# Patient Record
Sex: Female | Born: 1980 | Hispanic: No | Marital: Married | Smoking: Current every day smoker
Health system: Southern US, Community
[De-identification: ages and names within clinical notes are randomized; demographics above are authoritative.]

## PROBLEM LIST (undated history)

## (undated) DIAGNOSIS — F909 Attention-deficit hyperactivity disorder, unspecified type: Secondary | ICD-10-CM

## (undated) DIAGNOSIS — J45909 Unspecified asthma, uncomplicated: Secondary | ICD-10-CM

## (undated) HISTORY — PX: ABDOMINAL HYSTERECTOMY: SHX81

## (undated) HISTORY — PX: GASTRIC BYPASS: SHX52

---

## 1998-06-19 DIAGNOSIS — K5090 Crohn's disease, unspecified, without complications: Secondary | ICD-10-CM

## 1998-06-19 HISTORY — DX: Crohn's disease, unspecified, without complications: K50.90

## 1998-06-19 HISTORY — PX: CHOLECYSTECTOMY: SHX55

## 2003-06-20 HISTORY — PX: TUBAL LIGATION: SHX77

## 2007-06-20 HISTORY — PX: ABDOMINAL HYSTERECTOMY: SHX81

## 2010-06-19 DIAGNOSIS — I82409 Acute embolism and thrombosis of unspecified deep veins of unspecified lower extremity: Secondary | ICD-10-CM

## 2010-06-19 DIAGNOSIS — I2699 Other pulmonary embolism without acute cor pulmonale: Secondary | ICD-10-CM

## 2010-06-19 HISTORY — PX: KNEE SURGERY: SHX244

## 2010-06-19 HISTORY — DX: Acute embolism and thrombosis of unspecified deep veins of unspecified lower extremity: I82.409

## 2010-06-19 HISTORY — DX: Other pulmonary embolism without acute cor pulmonale: I26.99

## 2011-06-20 DIAGNOSIS — D333 Benign neoplasm of cranial nerves: Secondary | ICD-10-CM

## 2011-06-20 HISTORY — PX: KNEE SURGERY: SHX244

## 2011-06-20 HISTORY — DX: Benign neoplasm of cranial nerves: D33.3

## 2013-06-19 HISTORY — PX: HARDWARE REMOVAL: SHX979

## 2013-06-19 HISTORY — PX: TENDON RECONSTRUCTION: SHX2487

## 2015-10-26 DIAGNOSIS — Z9884 Bariatric surgery status: Secondary | ICD-10-CM | POA: Insufficient documentation

## 2015-10-26 DIAGNOSIS — Z980 Intestinal bypass and anastomosis status: Secondary | ICD-10-CM | POA: Insufficient documentation

## 2016-06-19 DIAGNOSIS — Z8719 Personal history of other diseases of the digestive system: Secondary | ICD-10-CM

## 2016-06-19 HISTORY — PX: LAPAROSCOPIC GASTRIC SLEEVE RESECTION: SHX5895

## 2016-06-19 HISTORY — DX: Personal history of other diseases of the digestive system: Z87.19

## 2017-06-19 HISTORY — PX: GASTRIC BYPASS: SHX52

## 2021-07-20 ENCOUNTER — Ambulatory Visit
Admission: EM | Admit: 2021-07-20 | Discharge: 2021-07-20 | Disposition: A | Discharge status: Home / Self Care | Attending: Internal Medicine | Admitting: Internal Medicine

## 2021-07-20 ENCOUNTER — Other Ambulatory Visit: Payer: Self-pay

## 2021-07-20 DIAGNOSIS — J069 Acute upper respiratory infection, unspecified: Secondary | ICD-10-CM | POA: Diagnosis present

## 2021-07-20 DIAGNOSIS — J029 Acute pharyngitis, unspecified: Secondary | ICD-10-CM | POA: Diagnosis present

## 2021-07-20 LAB — POCT INFLUENZA A/B
Influenza A, POC: NEGATIVE
Influenza B, POC: NEGATIVE

## 2021-07-20 LAB — POCT RAPID STREP A (OFFICE): Rapid Strep A Screen: NEGATIVE

## 2021-07-20 MED ORDER — ACETAMINOPHEN 325 MG PO TABS
650.0000 mg | ORAL_TABLET | Freq: Once | ORAL | Status: AC
  Administered 2021-07-20: 650 mg via ORAL

## 2021-07-20 MED ORDER — BENZONATATE 100 MG PO CAPS: 100.0000 mg | ORAL_CAPSULE | Freq: Three times a day (TID) | ORAL | 0 refills | Status: DC | PRN

## 2021-07-20 NOTE — ED Triage Notes (Signed)
2 day h/o runny nose, HA, body aches, cough, fever and sore throat with dysphagia. Has been taking theraflu without relief. Tmax 100.6. No v/d.

## 2021-07-20 NOTE — ED Provider Notes (Signed)
[facility]    CSN: [id number] Arrival date & time: 07/20/21  1324      History   Chief Complaint Chief Complaint  Patient presents with   Generalized Body Aches   Cough    HPI Jaydalee Bardwell is a 41 y.o. female.   Patient presents with 2-day history of runny nose, headache, body aches, nonproductive cough, fever, sore throat.  T-max at home was 100.6.  Denies any known sick contacts.  Patient has taken TheraFlu with minimal improvement in symptoms.  Denies chest pain, shortness of breath, nausea, vomiting, diarrhea, abdominal pain.   Cough  History reviewed. No pertinent past medical history.  There are no problems to display for this patient.   Past Surgical History:  Procedure Laterality Date   ABDOMINAL HYSTERECTOMY     partial   GASTRIC BYPASS      OB History   No obstetric history on file.      Home Medications    Prior to Admission medications   Medication Sig Start Date End Date Taking? Authorizing Provider  benzonatate (TESSALON) 100 MG capsule Take 1 capsule (100 mg total) by mouth every 8 (eight) hours as needed for cough. 07/20/21  Yes , Michele Rockers, FNP  omeprazole (PRILOSEC) 20 MG capsule omeprazole 20 mg oral delayed release capsule Start Date: 01/31/19 Status: Ordered 01/31/19  Yes [provider]    Family History Family History  Problem Relation Age of Onset   Hypertension Mother    Diabetes Father     Social History Social History   Tobacco Use   Smoking status: Every Day    Types: Cigarettes   Smokeless tobacco: Never     Allergies   Azithromycin, Cephalexin, Ciprofloxacin, Erythromycin, Ketorolac, Metronidazole, Morphine, Nitrofurantoin, Ondansetron, and Penicillins   Review of Systems Review of Systems Per HPI  Physical Exam Triage Vital Signs ED Triage Vitals  Enc Vitals Group     BP 07/20/21 1030 120/80     Pulse Rate 07/20/21 1030 88     Resp 07/20/21 1030 18     Temp 07/20/21 1030 (!) 100.4  F (38 C)     Temp Source 07/20/21 1030 Oral     SpO2 07/20/21 1030 97 %     Weight --      Height --      Head Circumference --      Peak Flow --      Pain Score 07/20/21 1032 8     Pain Loc --      Pain Edu? --      Excl. in [place]? --    No data found.  Updated Vital Signs BP 120/80 (BP Location: Left Arm)    Pulse 88    Temp (!) 100.4 F (38 C) (Oral)    Resp 18    SpO2 97%   Visual Acuity Right Eye Distance:   Left Eye Distance:   Bilateral Distance:    Right Eye Near:   Left Eye Near:    Bilateral Near:     Physical Exam Constitutional:      General: She is not in acute distress.    Appearance: Normal appearance. She is not toxic-appearing or diaphoretic.  HENT:     Head: Normocephalic and atraumatic.     Right Ear: Tympanic membrane and ear canal normal.     Left Ear: Tympanic membrane and ear canal normal.     Nose: Congestion present.     Mouth/Throat:  Mouth: Mucous membranes are moist.     Pharynx: Posterior oropharyngeal erythema present.  Eyes:     Extraocular Movements: Extraocular movements intact.     Conjunctiva/sclera: Conjunctivae normal.     Pupils: Pupils are equal, round, and reactive to light.  Cardiovascular:     Rate and Rhythm: Normal rate and regular rhythm.     Pulses: Normal pulses.     Heart sounds: Normal heart sounds.  Pulmonary:     Effort: Pulmonary effort is normal. No respiratory distress.     Breath sounds: Normal breath sounds. No stridor. No wheezing, rhonchi or rales.  Abdominal:     General: Abdomen is flat. Bowel sounds are normal.     Palpations: Abdomen is soft.  Musculoskeletal:        General: Normal range of motion.     Cervical back: Normal range of motion.  Skin:    General: Skin is warm and dry.  Neurological:     General: No focal deficit present.     Mental Status: She is alert and oriented to person, place, and time. Mental status is at baseline.  Psychiatric:        Mood and Affect: Mood normal.         Behavior: Behavior normal.     UC Treatments / Results  Labs (all labs ordered are listed, but only abnormal results are displayed) Labs Reviewed  NOVEL CORONAVIRUS, NAA  CULTURE, GROUP A STREP [facility])  POCT INFLUENZA A/B  POCT RAPID STREP A (OFFICE)    EKG   Radiology No results found.  Procedures Procedures (including critical care time)  Medications Ordered in UC Medications  acetaminophen (TYLENOL) tablet 650 mg (650 mg Oral Given 07/20/21 1039)    Initial Impression / Assessment and Plan / UC Course  I have reviewed the triage vital signs and the nursing notes.  Pertinent labs & imaging results that were available during my care of the patient were reviewed by me and considered in my medical decision making (see chart for details).     Patient presents with symptoms likely from a viral upper respiratory infection. Differential includes bacterial pneumonia, sinusitis, allergic rhinitis, COVID-19, flu. Do not suspect underlying cardiopulmonary process. Symptoms seem unlikely related to ACS, CHF or COPD exacerbations, pneumonia, pneumothorax. Patient is nontoxic appearing and not in need of emergent medical intervention.  Rapid flu and rapid strep are negative.  Throat culture and COVID-19 viral swab are pending.  Recommended symptom control with over the counter medications.  Patient sent prescription.  Return if symptoms fail to improve in 1-2 weeks or you develop shortness of breath, chest pain, severe headache. Patient states understanding and is agreeable.  Discharged with PCP followup.  Final Clinical Impressions(s) / UC Diagnoses   Final diagnoses:  Viral upper respiratory tract infection with cough  Sore throat     Discharge Instructions      It appears that you have a viral upper respiratory infection that should resolve in the next few days on its own.  You have been prescribed a cough medication.  Rapid strep and rapid flu are negative.  COVID-19 PCR is  pending.     ED Prescriptions     Medication Sig Dispense Auth. Provider   benzonatate (TESSALON) 100 MG capsule Take 1 capsule (100 mg total) by mouth every 8 (eight) hours as needed for cough. 21 capsule [place], Michele Rockers, [place]      PDMP not reviewed this encounter.   Arcata, Mena  E, FNP 07/20/21 1143

## 2021-07-20 NOTE — Discharge Instructions (Signed)
It appears that you have a viral upper respiratory infection that should resolve in the next few days on its own.  You have been prescribed a cough medication.  Rapid strep and rapid flu are negative.  COVID-19 PCR is pending.

## 2021-07-21 LAB — SARS-COV-2, NAA 2 DAY TAT

## 2021-07-21 LAB — NOVEL CORONAVIRUS, NAA: SARS-CoV-2, NAA: DETECTED — AB

## 2021-07-24 LAB — CULTURE, GROUP A STREP (THRC)

## 2021-08-15 ENCOUNTER — Ambulatory Visit (INDEPENDENT_AMBULATORY_CARE_PROVIDER_SITE_OTHER): Admitting: Nurse Practitioner

## 2021-08-15 ENCOUNTER — Encounter: Payer: Self-pay | Admitting: Nurse Practitioner

## 2021-08-15 ENCOUNTER — Other Ambulatory Visit: Payer: Self-pay

## 2021-08-15 VITALS — BP 112/79 | HR 79 | Resp 16 | Ht 63.0 in | Wt 206.0 lb

## 2021-08-15 DIAGNOSIS — R051 Acute cough: Secondary | ICD-10-CM | POA: Insufficient documentation

## 2021-08-15 DIAGNOSIS — J45909 Unspecified asthma, uncomplicated: Secondary | ICD-10-CM | POA: Insufficient documentation

## 2021-08-15 DIAGNOSIS — Z7689 Persons encountering health services in other specified circumstances: Secondary | ICD-10-CM | POA: Diagnosis not present

## 2021-08-15 DIAGNOSIS — K5010 Crohn's disease of large intestine without complications: Secondary | ICD-10-CM | POA: Insufficient documentation

## 2021-08-15 DIAGNOSIS — Z8616 Personal history of COVID-19: Secondary | ICD-10-CM

## 2021-08-15 DIAGNOSIS — H9192 Unspecified hearing loss, left ear: Secondary | ICD-10-CM

## 2021-08-15 DIAGNOSIS — F6389 Other impulse disorders: Secondary | ICD-10-CM | POA: Insufficient documentation

## 2021-08-15 MED ORDER — DOXYCYCLINE HYCLATE 100 MG PO TABS: 100.0000 mg | ORAL_TABLET | Freq: Two times a day (BID) | ORAL | 0 refills | Status: DC

## 2021-08-15 MED ORDER — PREDNISONE 10 MG PO TABS: 10.0000 mg | ORAL_TABLET | Freq: Every day | ORAL | 0 refills | Status: AC

## 2021-08-15 NOTE — Patient Instructions (Addendum)
Encounter to establish Left side hearing loss Chest congestion Cough History of COVID:   Stay well hydrated  Stay active  Deep breathing exercises  May take tylenol or fever or pain  May take mucinex twice daily  Will place referral to ENT  Will order:  Meds ordered this encounter  Medications   doxycycline (VIBRA-TABS) 100 MG tablet    Sig: Take 1 tablet (100 mg total) by mouth 2 (two) times daily for 10 days.    Dispense:  20 tablet    Refill:  0   predniSONE (DELTASONE) 10 MG tablet    Sig: Take 1 tablet (10 mg total) by mouth daily with breakfast for 5 days.    Dispense:  5 tablet    Refill:  0   Orders Placed This Encounter  Procedures   Ambulatory referral to ENT    Referral Priority:   Routine    Referral Type:   Consultation    Referral Reason:   Specialty Services Required    Requested Specialty:   Otolaryngology    Number of Visits Requested:   1      Follow up:  Follow up in 2 weeks for ADHD

## 2021-08-15 NOTE — Assessment & Plan Note (Signed)
Orders Placed This Encounter  Procedures   Ambulatory referral to ENT    Referral Priority:   Routine    Referral Type:   Consultation    Referral Reason:   Specialty Services Required    Requested Specialty:   Otolaryngology    Number of Visits Requested:   1    

## 2021-08-15 NOTE — Progress Notes (Signed)
@Patient  ID: Sophia Long, female    DOB: 01/11/1981, 41 y.o.   MRN: [id number]  Chief Complaint  Patient presents with   New Patient (Initial Visit)   Hospitalization Follow-up    Urgent care follow up     Referring provider: No ref. provider found  HPI  Patient presents today to establish care.  Patient states that she had COVID at the beginning of February and states that she still has chest congestion and cough.  She has not taken any antibiotics or any oral antiviral therapy for COVID.  We discussed that we can start her on a round of antibiotics and prednisone to help her get over the chest congestion and cough.  Patient does have a remote history of asthma.  Patient does have a history of ADHD, left acoustic neuroma (which she needs a referral to ENT), history of insomnia, and history of bilateral patellar realignment. Patient has been out of her ADHD medication and ambien for about 6 months now. Denies f/c/s, n/v/d, hemoptysis, PND, leg swelling Denies chest pain or edema.  Note: discussed with patient that Adderall and Ambien are controlled substances.  I will not be her PCP but will get an appointment set up for her with her new PCP to discuss refilling Ambien and Adderall going forward.  I would not be able to refill these today due to the medications being controlled.  Patient is fine with this.     Allergies  Allergen Reactions   Azithromycin     Per [facility] [id number]    Cephalexin    Ciprofloxacin    Erythromycin    Ketorolac    Metronidazole    Morphine    Nitrofurantoin     DIFFICULTY BREATHING,HIVES    Ondansetron     Per [facility] [id number]    Penicillins     TROUBLE BREATHING      There is no immunization history on file for this patient.  History reviewed. No pertinent past medical history.  Tobacco History: Social History   Tobacco Use  Smoking Status Every Day   Types: Cigarettes  Smokeless Tobacco Never   Ready to quit: Not  Answered Counseling given: Not Answered   Outpatient Encounter Medications as of 08/15/2021  Medication Sig   amphetamine-dextroamphetamine (ADDERALL) 30 MG tablet    doxycycline (VIBRA-TABS) 100 MG tablet Take 1 tablet (100 mg total) by mouth 2 (two) times daily for 10 days.   omeprazole (PRILOSEC) 20 MG capsule omeprazole 20 mg oral delayed release capsule Start Date: 01/31/19 Status: Ordered   predniSONE (DELTASONE) 10 MG tablet Take 1 tablet (10 mg total) by mouth daily with breakfast for 5 days.   zolpidem (AMBIEN) 10 MG tablet zolpidem 10 mg oral tablet Start Date: 02/21/19 Status: Ordered   benzonatate (TESSALON) 100 MG capsule Take 1 capsule (100 mg total) by mouth every 8 (eight) hours as needed for cough.   No facility-administered encounter medications on file as of 08/15/2021.     Review of Systems  Review of Systems  Constitutional: Negative.   HENT: Negative.    Respiratory:  Positive for cough.        Chest congestion  Cardiovascular: Negative.   Gastrointestinal: Negative.   Allergic/Immunologic: Negative.   Neurological: Negative.   Psychiatric/Behavioral: Negative.        Physical Exam  BP 112/79    Pulse 79    Resp 16    Ht 5\' 3"  (1.6 m)    Wt 206 lb (93.4 kg)  SpO2 98%    BMI 36.49 kg/m   Wt Readings from Last 5 Encounters:  08/15/21 206 lb (93.4 kg)     Physical Exam Vitals and nursing note reviewed.  Constitutional:      General: She is not in acute distress.    Appearance: She is well-developed.  Cardiovascular:     Rate and Rhythm: Normal rate and regular rhythm.  Pulmonary:     Effort: Pulmonary effort is normal.     Breath sounds: Normal breath sounds.  Neurological:     Mental Status: She is alert and oriented to person, place, and time.     Lab Results:  CBC No results found for: WBC, RBC, HGB, HCT, PLT, MCV, MCH, MCHC, RDW, LYMPHSABS, MONOABS, EOSABS, BASOSABS  BMET No results found for: NA, K, CL, CO2, GLUCOSE, BUN,  CREATININE, CALCIUM, GFRNONAA, GFRAA  BNP No results found for: BNP  ProBNP No results found for: PROBNP  Imaging: No results found.   Assessment & Plan:   History of COVID-19 Orders Placed This Encounter  Procedures   Ambulatory referral to ENT    Referral Priority:   Routine    Referral Type:   Consultation    Referral Reason:   Specialty Services Required    Requested Specialty:   Otolaryngology    Number of Visits Requested:   1   Patient Instructions  Encounter to establish Left side hearing loss Chest congestion Cough History of COVID:   Stay well hydrated  Stay active  Deep breathing exercises  May take tylenol or fever or pain  May take mucinex twice daily  Will place referral to ENT  Will order:  Meds ordered this encounter  Medications   doxycycline (VIBRA-TABS) 100 MG tablet    Sig: Take 1 tablet (100 mg total) by mouth 2 (two) times daily for 10 days.    Dispense:  20 tablet    Refill:  0   predniSONE (DELTASONE) 10 MG tablet    Sig: Take 1 tablet (10 mg total) by mouth daily with breakfast for 5 days.    Dispense:  5 tablet    Refill:  0   Orders Placed This Encounter  Procedures   Ambulatory referral to ENT    Referral Priority:   Routine    Referral Type:   Consultation    Referral Reason:   Specialty Services Required    Requested Specialty:   Otolaryngology    Number of Visits Requested:   1      Follow up:  Follow up in 2 weeks for ADHD     Fenton Foy, NP 08/15/2021

## 2021-08-22 ENCOUNTER — Ambulatory Visit (INDEPENDENT_AMBULATORY_CARE_PROVIDER_SITE_OTHER): Admitting: Family Medicine

## 2021-08-22 ENCOUNTER — Other Ambulatory Visit: Payer: Self-pay

## 2021-08-22 ENCOUNTER — Encounter: Payer: Self-pay | Admitting: Family Medicine

## 2021-08-22 VITALS — BP 124/84 | HR 60 | Temp 97.9°F | Resp 16 | Wt 205.2 lb

## 2021-08-22 DIAGNOSIS — M25561 Pain in right knee: Secondary | ICD-10-CM | POA: Diagnosis not present

## 2021-08-22 DIAGNOSIS — G4700 Insomnia, unspecified: Secondary | ICD-10-CM | POA: Diagnosis not present

## 2021-08-22 DIAGNOSIS — K219 Gastro-esophageal reflux disease without esophagitis: Secondary | ICD-10-CM | POA: Diagnosis not present

## 2021-08-22 DIAGNOSIS — G8929 Other chronic pain: Secondary | ICD-10-CM

## 2021-08-22 DIAGNOSIS — F900 Attention-deficit hyperactivity disorder, predominantly inattentive type: Secondary | ICD-10-CM

## 2021-08-22 DIAGNOSIS — Z7689 Persons encountering health services in other specified circumstances: Secondary | ICD-10-CM

## 2021-08-22 DIAGNOSIS — M25562 Pain in left knee: Secondary | ICD-10-CM

## 2021-08-22 MED ORDER — TRAZODONE HCL 100 MG PO TABS: 100.0000 mg | ORAL_TABLET | Freq: Every day | ORAL | 1 refills | Status: AC

## 2021-08-22 MED ORDER — OMEPRAZOLE 20 MG PO CPDR: 20.0000 mg | DELAYED_RELEASE_CAPSULE | Freq: Every day | ORAL | 1 refills | Status: DC

## 2021-08-22 MED ORDER — AMPHETAMINE-DEXTROAMPHETAMINE 30 MG PO TABS: 30.0000 mg | ORAL_TABLET | Freq: Every day | ORAL | 0 refills | Status: DC

## 2021-08-22 NOTE — Progress Notes (Signed)
Patient is her to establish care with PCP ? ?Patient would like a medication refill ?Patient would like referral; for otho for knee and GI for stomach ulcer ?

## 2021-08-22 NOTE — Progress Notes (Signed)
? ?Established Patient Office Visit ? ?Subjective:  ?Patient ID: Sophia Long, female    DOB: 10-10-1980  Age: 41 y.o. MRN: [id number] ? ?CC:  ?Chief Complaint  ?Patient presents with  ? Follow-up  ? Depression  ? Medication Refill  ? ? ?HPI ?Sophia Long presents for follow up of chronic med issues including chronic bilateral knee pain and GERD and ADHD.Patient reports that she has had previous surgical intervention on her needs and requests a referral. ? ?No past medical history on file. ? ?Past Surgical History:  ?Procedure Laterality Date  ? ABDOMINAL HYSTERECTOMY    ? partial  ? GASTRIC BYPASS    ? ? ?Family History  ?Problem Relation Age of Onset  ? Hypertension Mother   ? Diabetes Father   ? ? ?Social History  ? ?Socioeconomic History  ? Marital status: Married  ?  Spouse name: Not on file  ? Number of children: Not on file  ? Years of education: Not on file  ? Highest education level: Not on file  ?Occupational History  ? Not on file  ?Tobacco Use  ? Smoking status: Every Day  ?  Types: Cigarettes  ? Smokeless tobacco: Never  ?Substance and Sexual Activity  ? Alcohol use: Not on file  ? Drug use: Not on file  ? Sexual activity: Yes  ?Other Topics Concern  ? Not on file  ?Social History Narrative  ? Not on file  ? ?Social Determinants of Health  ? ?Financial Resource Strain: Not on file  ?Food Insecurity: Not on file  ?Transportation Needs: Not on file  ?Physical Activity: Not on file  ?Stress: Not on file  ?Social Connections: Not on file  ?Intimate Partner Violence: Not on file  ? ? ?ROS ?Review of Systems  ?Psychiatric/Behavioral:  Positive for agitation, decreased concentration and sleep disturbance. Negative for self-injury and suicidal ideas. The patient is nervous/anxious and is hyperactive.   ?All other systems reviewed and are negative. ? ?Objective:  ? ?Today's Vitals: BP 124/84   Pulse 60   Temp 97.9 ?F (36.6 ?C) (Oral)   Resp 16   Wt 205 lb 3.2 oz (93.1 kg)   SpO2 98%   BMI 36.35 kg/m?   ? ?Physical Exam ?Vitals and nursing note reviewed.  ?Constitutional:   ?   General: She is not in acute distress. ?   Appearance: She is obese.  ?Cardiovascular:  ?   Rate and Rhythm: Normal rate and regular rhythm.  ?Pulmonary:  ?   Effort: Pulmonary effort is normal.  ?   Breath sounds: Normal breath sounds.  ?Abdominal:  ?   Palpations: Abdomen is soft.  ?   Tenderness: There is no abdominal tenderness.  ?Neurological:  ?   General: No focal deficit present.  ?   Mental Status: She is alert and oriented to person, place, and time.  ?Psychiatric:     ?   Mood and Affect: Mood is anxious.     ?   Speech: Speech is rapid and pressured.     ?   Behavior: Behavior is hyperactive. Behavior is cooperative.  ? ? ?Assessment & Plan:  ? ?1. Gastroesophageal reflux disease without esophagitis ?Omeprazole prescribed and patient referred to GI For further eval/mgt ? ?- omeprazole (PRILOSEC) 20 MG capsule; Take 1 capsule (20 mg total) by mouth daily.  Dispense: 90 capsule; Refill: 1 ?- Ambulatory referral to Gastroenterology ? ?2. Attention deficit hyperactivity disorder (ADHD), predominantly inattentive type ?Adderall refilled. Will attempt to get  records from previous provider. Monitor. ? ?- amphetamine-dextroamphetamine (ADDERALL) 30 MG tablet; Take 1 tablet by mouth daily.  Dispense: 30 tablet; Refill: 0 ? ?3. Insomnia, unspecified type ?Trazodone prescribed.  ? ?4. Chronic pain of both knees ?Referral to ortho for further eval/mgt. ? ?- Ambulatory referral to Orthopedic Surgery ? ? ?Outpatient Encounter Medications as of 08/22/2021  ?Medication Sig  ? amphetamine-dextroamphetamine (ADDERALL) 30 MG tablet   ? benzonatate (TESSALON) 100 MG capsule Take 1 capsule (100 mg total) by mouth every 8 (eight) hours as needed for cough. (Patient not taking: Reported on 08/22/2021)  ? doxycycline (VIBRA-TABS) 100 MG tablet Take 1 tablet (100 mg total) by mouth 2 (two) times daily for 10 days. (Patient not taking: Reported on 08/22/2021)   ? omeprazole (PRILOSEC) 20 MG capsule omeprazole 20 mg oral delayed release capsule ?Start Date: 01/31/19 ?Status: Ordered  ? zolpidem (AMBIEN) 10 MG tablet zolpidem 10 mg oral tablet ?Start Date: 02/21/19 ?Status: Ordered  ? ?No facility-administered encounter medications on file as of 08/22/2021.  ? ? ?Follow-up: No follow-ups on file.  ? ?Becky Sax, MD ? ?

## 2021-08-23 ENCOUNTER — Encounter: Payer: Self-pay | Admitting: Family Medicine

## 2021-08-25 ENCOUNTER — Ambulatory Visit (INDEPENDENT_AMBULATORY_CARE_PROVIDER_SITE_OTHER): Admitting: Orthopaedic Surgery

## 2021-08-25 ENCOUNTER — Other Ambulatory Visit: Payer: Self-pay

## 2021-08-25 ENCOUNTER — Ambulatory Visit (INDEPENDENT_AMBULATORY_CARE_PROVIDER_SITE_OTHER)

## 2021-08-25 ENCOUNTER — Ambulatory Visit: Payer: Self-pay

## 2021-08-25 ENCOUNTER — Encounter: Payer: Self-pay | Admitting: Orthopaedic Surgery

## 2021-08-25 DIAGNOSIS — M1711 Unilateral primary osteoarthritis, right knee: Secondary | ICD-10-CM

## 2021-08-25 DIAGNOSIS — M25562 Pain in left knee: Secondary | ICD-10-CM

## 2021-08-25 DIAGNOSIS — M25561 Pain in right knee: Secondary | ICD-10-CM

## 2021-08-25 DIAGNOSIS — M1712 Unilateral primary osteoarthritis, left knee: Secondary | ICD-10-CM | POA: Diagnosis not present

## 2021-08-25 DIAGNOSIS — G8929 Other chronic pain: Secondary | ICD-10-CM | POA: Diagnosis not present

## 2021-08-25 NOTE — Progress Notes (Signed)
? ?Office Visit Note ?  ?Patient: Sophia Long           ?Date of Birth: 1980/11/30           ?MRN: [id number] ?Visit Date: 08/25/2021 ?             ?Requested by: Dorna Mai, MD ?[phone] ?[place],  [place] [address] ?PCP: Dorna Mai, MD ? ? ?Assessment & Plan: ?Visit Diagnoses:  ?1. Primary osteoarthritis of right knee   ?2. Primary osteoarthritis of left knee   ? ? ?Plan: Impression is advanced patellofemoral arthritis of the right knee and mild osteoarthritis of the left knee.  Again treatment options were discussed to include nonoperative and operative treatments and their associated pros and cons.  For the left knee she is not ready for any operative treatment so we will continue conservative management.  For the right knee she is ready for arthroplasty and surgical treatment.  I do need to get MRI for preoperative planning to see if she is a candidate for patellofemoral arthroplasty versus a total knee replacement.  Patient will follow-up after the MRI. ? ?Follow-Up Instructions: No follow-ups on file.  ? ?Orders:  ?Orders Placed This Encounter  ?Procedures  ? XR KNEE 3 VIEW RIGHT  ? XR KNEE 3 VIEW LEFT  ? MR Knee Right w/o contrast  ? ?No orders of the defined types were placed in this encounter. ? ? ? ? Procedures: ?No procedures performed ? ? ?Clinical Data: ?No additional findings. ? ? ?Subjective: ?Chief Complaint  ?Patient presents with  ? Left Knee - Pain  ? Right Knee - Pain  ? ? ?HPI ? ?Sophia Long is a very pleasant 41 year old female here for evaluation of bilateral knee pain greater on the right.  She has diffuse pain throughout the right knee that is severe.  She has had 3 surgeries on the right knee that included tibial tubercle realignment and MPFL reconstruction.  She is also had several cortisone and Visco injections after the surgeries which were ineffective.  She also had a tibial tubercle realignment surgery on the left knee.  This was all done in [place].  She recently  relocated to [facility].  She feels constant pain and grinding behind her kneecaps worse on the right knee.  She has also undergone extensive physical therapy over several years.  She reports pain and swelling with increased activity and towards the end of the day. ? ?Review of Systems  ?Constitutional: Negative.   ?HENT: Negative.    ?Eyes: Negative.   ?Respiratory: Negative.    ?Cardiovascular: Negative.   ?Endocrine: Negative.   ?Musculoskeletal: Negative.   ?Neurological: Negative.   ?Hematological: Negative.   ?Psychiatric/Behavioral: Negative.    ?All other systems reviewed and are negative. ? ? ?Objective: ?Vital Signs: There were no vitals taken for this visit. ? ?Physical Exam ?Vitals and nursing note reviewed.  ?Constitutional:   ?   Appearance: She is well-developed.  ?HENT:  ?   Head: Normocephalic and atraumatic.  ?Pulmonary:  ?   Effort: Pulmonary effort is normal.  ?Abdominal:  ?   Palpations: Abdomen is soft.  ?Musculoskeletal:  ?   Cervical back: Neck supple.  ?Skin: ?   General: Skin is warm.  ?   Capillary Refill: Capillary refill takes less than 2 seconds.  ?Neurological:  ?   Mental Status: She is alert and oriented to person, place, and time.  ?Psychiatric:     ?   Behavior: Behavior normal.     ?  Thought Content: Thought content normal.     ?   Judgment: Judgment normal.  ? ? ?Ortho Exam ? ?Examination of the right knee shows multiple surgical scars that are fully healed.  Significant patellofemoral crepitus with range of motion which is painful throughout the arc of motion.  Collaterals and cruciates are stable.  Patella tracking is normal.  Slight valgus alignment.  Pain with knee flexion past 60 degrees. ? ?Examination of the left knee shows fully healed surgical scars.  1+ patellofemoral crepitus with range of motion.  Preserved range of motion.  Collaterals and cruciates are stable.  No jointline tenderness.  Normal patellar tracking. ? ?Specialty Comments:  ?No specialty comments  available. ? ?Imaging: ?XR KNEE 3 VIEW LEFT ? ?Result Date: 08/25/2021 ?Mild osteoarthritis with preserved joint spaces.  2 metallic screws from prior tibial tubercle realignment surgery. ? ?XR KNEE 3 VIEW RIGHT ? ?Result Date: 08/25/2021 ?Advanced patellofemoral degenerative joint disease with bone-on-bone joint space narrowing.  Mild to moderate femoral-tibial compartment arthritis with preserved joint spaces.  ? ? ?PMFS History: ?Patient Active Problem List  ? Diagnosis Date Noted  ? Primary osteoarthritis of right knee 08/25/2021  ? Primary osteoarthritis of left knee 08/25/2021  ? 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 Asthma 08/15/2021  ? Hearing loss of left ear 08/15/2021  ? Encounter to establish care 08/15/2021  ? History of COVID-19 08/15/2021  ? Acute cough 08/15/2021  ? ?History reviewed. No pertinent past medical history.  ?Family History  ?Problem Relation Age of Onset  ? Hypertension Mother   ? Diabetes Father   ?  ?Past Surgical History:  ?Procedure Laterality Date  ? ABDOMINAL HYSTERECTOMY    ? partial  ? GASTRIC BYPASS    ? ?Social History  ? ?Occupational History  ? Not on file  ?Tobacco Use  ? Smoking status: Every Day  ?  Types: Cigarettes  ? Smokeless tobacco: Never  ?Substance and Sexual Activity  ? Alcohol use: Not on file  ? Drug use: Not on file  ? Sexual activity: Yes  ? ? ? ? ? ? ?

## 2021-08-30 ENCOUNTER — Other Ambulatory Visit: Payer: Self-pay

## 2021-08-30 ENCOUNTER — Encounter: Payer: Self-pay | Admitting: Gastroenterology

## 2021-08-30 ENCOUNTER — Ambulatory Visit
Admission: RE | Admit: 2021-08-30 | Discharge: 2021-08-30 | Disposition: A | Discharge status: Home / Self Care | Source: Ambulatory Visit | Attending: Orthopaedic Surgery | Admitting: Orthopaedic Surgery

## 2021-08-30 DIAGNOSIS — M1712 Unilateral primary osteoarthritis, left knee: Secondary | ICD-10-CM

## 2021-08-30 NOTE — Progress Notes (Signed)
Needs appt

## 2021-09-06 ENCOUNTER — Other Ambulatory Visit: Payer: Self-pay

## 2021-09-06 ENCOUNTER — Ambulatory Visit (INDEPENDENT_AMBULATORY_CARE_PROVIDER_SITE_OTHER): Admitting: Orthopaedic Surgery

## 2021-09-06 ENCOUNTER — Encounter: Payer: Self-pay | Admitting: Orthopaedic Surgery

## 2021-09-06 DIAGNOSIS — M1711 Unilateral primary osteoarthritis, right knee: Secondary | ICD-10-CM | POA: Diagnosis not present

## 2021-09-06 NOTE — Progress Notes (Signed)
? ?  Office Visit Note ?  ?Patient: Sophia Long           ?Date of Birth: 11-15-1980           ?MRN: [id number] ?Visit Date: 09/06/2021 ?             ?Requested by: Dorna Mai, MD ?[address] [phone] ?[place],  [place] [address] ?PCP: Dorna Mai, MD ? ? ?Assessment & Plan: ?Visit Diagnoses:  ?1. Primary osteoarthritis of right knee   ? ? ?Plan: Sophia Long is back today to discuss right knee MRI. ? ?Examination of the right knee is unchanged.  She has a healed anterior surgical scar from prior patellar realignment surgery.  She also has a medial scar from prior MPFL reconstruction. ? ?MRI shows advanced tricompartmental degenerative joint disease with bone-on-bone joint space narrowing.  Given these findings I have recommended a total knee replacement.  She understands that she is not a candidate for patellofemoral arthroplasty given these findings.  Risk benefits rehab recovery prognosis from a total knee replacement reviewed with the patient detail.  Denies nickel allergy.  She did have a PE with prior right knee surgery.  She is no longer on anticoagulation and this happened years ago.  My plan will be to keep her on Xarelto for a month after the knee replacement and then switch over to aspirin for another month after that.  Debbie met with the patient today to schedule surgery. ? ?Follow-Up Instructions: No follow-ups on file.  ? ?Orders:  ?No orders of the defined types were placed in this encounter. ? ?No orders of the defined types were placed in this encounter. ? ? ? ? Procedures: ?No procedures performed ? ? ?Clinical Data: ?No additional findings. ? ? ?Subjective: ?Chief Complaint  ?Patient presents with  ? Right Knee - Pain  ? ? ?HPI ? ?Review of Systems ? ? ?Objective: ?Vital Signs: There were no vitals taken for this visit. ? ?Physical Exam ? ?Ortho Exam ? ?Specialty Comments:  ?No specialty comments available. ? ?Imaging: ?No results found. ? ? ?PMFS History: ?Patient Active Problem List  ?  Diagnosis Date Noted  ? Primary osteoarthritis of right knee 08/25/2021  ? Primary osteoarthritis of left knee 08/25/2021  ? 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 Asthma 08/15/2021  ? Hearing loss of left ear 08/15/2021  ? Encounter to establish care 08/15/2021  ? History of COVID-19 08/15/2021  ? Acute cough 08/15/2021  ? ?History reviewed. No pertinent past medical history.  ?Family History  ?Problem Relation Age of Onset  ? Hypertension Mother   ? Diabetes Father   ?  ?Past Surgical History:  ?Procedure Laterality Date  ? ABDOMINAL HYSTERECTOMY    ? partial  ? GASTRIC BYPASS    ? ?Social History  ? ?Occupational History  ? Not on file  ?Tobacco Use  ? Smoking status: Every Day  ?  Types: Cigarettes  ? Smokeless tobacco: Never  ?Substance and Sexual Activity  ? Alcohol use: Not on file  ? Drug use: Not on file  ? Sexual activity: Yes  ? ? ? ? ? ? ?

## 2021-09-12 NOTE — Pre-Procedure Instructions (Signed)
Surgical Instructions ? ? ? Your procedure is scheduled on Monday, April 3rd. ? Report to [facility] Main Entrance "A" at 7:00 A.M., then check in with the Admitting office. ? Call this number if you have problems the morning of surgery: ? [phone] ? ? If you have any questions prior to your surgery date call [phone]: Open Monday-Friday 8am-4pm ? ? ? Remember: ? Do not eat after midnight the night before your surgery ? ?You may drink clear liquids until 6:00 a.m. the morning of your surgery.   ?Clear liquids allowed are: Water, Non-Citrus Juices (without pulp), Carbonated Beverages, Clear Tea, Black Coffee ONLY (NO MILK, CREAM OR POWDERED CREAMER of any kind), and Gatorade. ? ? ?Enhanced Recovery after Surgery for Orthopedics ?Enhanced Recovery after Surgery is a protocol used to improve the stress on your body and your recovery after surgery. ? ?Patient Instructions ? ?The day of surgery (if you do NOT have diabetes):  ?Drink ONE (1) Pre-Surgery Clear Ensure by 6:00 am the morning of surgery   ?This drink was given to you during your hospital  ?pre-op appointment visit. ?Nothing else to drink after completing the  ?Pre-Surgery Clear Ensure. ? ?       If you have questions, please contact your surgeon?s office. ? ?  ? Take these medicines the morning of surgery with A SIP OF WATER:  ?omeprazole (PRILOSEC) ? ?As of today, STOP taking any Aspirin (unless otherwise instructed by your surgeon) Aleve, Naproxen, Ibuprofen, Motrin, Advil, Goody's, BC's, all herbal medications, fish oil, and all vitamins. ? ?         ?Do not wear jewelry or makeup ?Do not wear lotions, powders, perfumes, or deodorant. ?Do not shave 48 hours prior to surgery. Marland Kitchen ?Do not bring valuables to the hospital. ?Do not wear nail polish, gel polish, artificial nails, or any other type of covering on natural nails (fingers and toes) ?If you have artificial nails or gel coating that need to be removed by a nail salon, please have this removed  prior to surgery. Artificial nails or gel coating may interfere with anesthesia's ability to adequately monitor your vital signs. ? ?[facility] is not responsible for any belongings or valuables. .  ? ?Do NOT Smoke (Tobacco/Vaping)  24 hours prior to your procedure ? ?If you use a CPAP at night, you may bring your mask for your overnight stay. ?  ?Contacts, glasses, hearing aids, dentures or partials may not be worn into surgery, please bring cases for these belongings ?  ?For patients admitted to the hospital, discharge time will be determined by your treatment team. ?  ?Patients discharged the day of surgery will not be allowed to drive home, and someone needs to stay with them for 24 hours. ? ? ?SURGICAL WAITING ROOM VISITATION ?Patients having surgery or a procedure in a hospital may have two support people. ?Children under the age of 31 must have an adult with them who is not the patient. ?They may stay in the waiting area during the procedure and may switch out with other visitors. If the patient needs to stay at the hospital during part of their recovery, the visitor guidelines for inpatient rooms apply. ? ?Please refer to the [facility] website for the visitor guidelines for Inpatients (after your surgery is over and you are in a regular room).  ? ? ? ? ? ?Special instructions:   ? ?Oral Hygiene is also important to reduce your risk of infection.  Remember - BRUSH  YOUR TEETH THE MORNING OF SURGERY WITH YOUR REGULAR TOOTHPASTE ? ? ?[facility]- Preparing For Surgery ? ?Before surgery, you can play an important role. Because skin is not sterile, your skin needs to be as free of germs as possible. You can reduce the number of germs on your skin by washing with CHG (chlorahexidine gluconate) Soap before surgery.  CHG is an antiseptic cleaner which kills germs and bonds with the skin to continue killing germs even after washing.   ? ? ?Please do not use if you have an allergy to CHG or antibacterial soaps. If  your skin becomes reddened/irritated stop using the CHG.  ?Do not shave (including legs and underarms) for at least 48 hours prior to first CHG shower. It is OK to shave your face. ? ?Please follow these instructions carefully. ?  ? ? Shower the NIGHT BEFORE SURGERY and the MORNING OF SURGERY with CHG Soap.  ? If you chose to wash your hair, wash your hair first as usual with your normal shampoo. After you shampoo, rinse your hair and body thoroughly to remove the shampoo.  Then [employer] and genitals (private parts) with your normal soap and rinse thoroughly to remove soap. ? ?After that Use CHG Soap as you would any other liquid soap. You can apply CHG directly to the skin and wash gently with a scrungie or a clean washcloth.  ? ?Apply the CHG Soap to your body ONLY FROM THE NECK DOWN.  Do not use on open wounds or open sores. Avoid contact with your eyes, ears, mouth and genitals (private parts). Wash Face and genitals (private parts)  with your normal soap.  ? ?Wash thoroughly, paying special attention to the area where your surgery will be performed. ? ?Thoroughly rinse your body with warm water from the neck down. ? ?DO NOT shower/wash with your normal soap after using and rinsing off the CHG Soap. ? ?Pat yourself dry with a CLEAN TOWEL. ? ?Wear CLEAN PAJAMAS to bed the night before surgery ? ?Place CLEAN SHEETS on your bed the night before your surgery ? ?DO NOT SLEEP WITH PETS. ? ? ?Day of Surgery: ? ?Take a shower with CHG soap. ?Wear Clean/Comfortable clothing the morning of surgery ?Do not apply any deodorants/lotions.   ?Remember to brush your teeth WITH YOUR REGULAR TOOTHPASTE. ? ? ? ?If you received a COVID test during your pre-op visit  it is requested that you wear a mask when out in public, stay away from anyone that may not be feeling well and notify your surgeon if you develop symptoms. If you have been in contact with anyone that has tested positive in the last 10 days please notify you  surgeon. ? ?  ?Please read over the following fact sheets that you were given.  ? ?

## 2021-09-13 ENCOUNTER — Encounter (HOSPITAL_COMMUNITY)
Admission: RE | Admit: 2021-09-13 | Discharge: 2021-09-13 | Disposition: A | Discharge status: Home / Self Care | Source: Ambulatory Visit | Attending: Orthopaedic Surgery | Admitting: Orthopaedic Surgery

## 2021-09-13 ENCOUNTER — Other Ambulatory Visit: Payer: Self-pay

## 2021-09-13 ENCOUNTER — Encounter (HOSPITAL_COMMUNITY): Payer: Self-pay

## 2021-09-13 VITALS — BP 134/86 | HR 75 | Temp 98.3°F | Resp 18 | Ht 63.0 in | Wt 204.3 lb

## 2021-09-13 DIAGNOSIS — M1711 Unilateral primary osteoarthritis, right knee: Secondary | ICD-10-CM | POA: Diagnosis not present

## 2021-09-13 DIAGNOSIS — Z01818 Encounter for other preprocedural examination: Secondary | ICD-10-CM

## 2021-09-13 DIAGNOSIS — Z01812 Encounter for preprocedural laboratory examination: Secondary | ICD-10-CM | POA: Diagnosis present

## 2021-09-13 DIAGNOSIS — M1712 Unilateral primary osteoarthritis, left knee: Secondary | ICD-10-CM

## 2021-09-13 HISTORY — DX: Unspecified asthma, uncomplicated: J45.909

## 2021-09-13 HISTORY — DX: Attention-deficit hyperactivity disorder, unspecified type: F90.9

## 2021-09-13 LAB — CBC
HCT: 37 % (ref 36.0–46.0)
Hemoglobin: 12.2 g/dL (ref 12.0–15.0)
MCH: 29.5 pg (ref 26.0–34.0)
MCHC: 33 g/dL (ref 30.0–36.0)
MCV: 89.4 fL (ref 80.0–100.0)
Platelets: 253 10*3/uL (ref 150–400)
RBC: 4.14 MIL/uL (ref 3.87–5.11)
RDW: 17 % — ABNORMAL HIGH (ref 11.5–15.5)
WBC: 6.8 10*3/uL (ref 4.0–10.5)
nRBC: 0 % (ref 0.0–0.2)

## 2021-09-13 LAB — COMPREHENSIVE METABOLIC PANEL
ALT: 25 U/L (ref 0–44)
AST: 21 U/L (ref 15–41)
Albumin: 3.8 g/dL (ref 3.5–5.0)
Alkaline Phosphatase: 55 U/L (ref 38–126)
Anion gap: 7 (ref 5–15)
BUN: 10 mg/dL (ref 6–20)
CO2: 25 mmol/L (ref 22–32)
Calcium: 8.9 mg/dL (ref 8.9–10.3)
Chloride: 109 mmol/L (ref 98–111)
Creatinine, Ser: 0.64 mg/dL (ref 0.44–1.00)
GFR, Estimated: >60 mL/min (ref >60)
Glucose, Bld: 114 mg/dL — ABNORMAL HIGH (ref 70–99)
Potassium: 4 mmol/L (ref 3.5–5.1)
Sodium: 141 mmol/L (ref 135–145)
Total Bilirubin: 1.1 mg/dL (ref 0.3–1.2)
Total Protein: 7.1 g/dL (ref 6.5–8.1)

## 2021-09-13 LAB — SURGICAL PCR SCREEN
MRSA, PCR: NEGATIVE
Staphylococcus aureus: NEGATIVE

## 2021-09-13 NOTE — Progress Notes (Signed)
PCP - Dorna Mai ?Cardiologist - Denies ? ?Chest x-ray - Not indicated ?EKG - Not indicated ?Stress Test - Denies ?ECHO - Denies ?Cardiac Cath - Denies ? ?Sleep Study - Years ago no OSA ? ?DM - Denies ? ?Blood Thinner Instructions: Denies ?Aspirin Instructions: Denies ? ?ERAS Protcol -Yes ?PRE-SURGERY Ensure   ? ?COVID TEST- Not indicated ? ? ?Anesthesia review: No ? ?Patient denies shortness of breath, fever, cough and chest pain at PAT appointment ? ? ?All instructions explained to the patient, with a verbal understanding of the material. Patient agrees to go over the instructions while at home for a better understanding.  The opportunity to ask questions was provided. ? ? ?

## 2021-09-16 ENCOUNTER — Encounter: Payer: Self-pay | Admitting: Gastroenterology

## 2021-09-16 ENCOUNTER — Ambulatory Visit (INDEPENDENT_AMBULATORY_CARE_PROVIDER_SITE_OTHER): Admitting: Gastroenterology

## 2021-09-16 VITALS — BP 120/60 | HR 60 | Ht 63.0 in | Wt 202.3 lb

## 2021-09-16 DIAGNOSIS — Z9884 Bariatric surgery status: Secondary | ICD-10-CM

## 2021-09-16 DIAGNOSIS — K219 Gastro-esophageal reflux disease without esophagitis: Secondary | ICD-10-CM | POA: Diagnosis not present

## 2021-09-16 NOTE — Progress Notes (Signed)
? ?HPI : Sophia Long is a very pleasant 41 year old female with a history of obesity s/p gastric sleeve with subsequent RNYGB conversion who is referred to [place] by Dr. Dorna Mai for further evaluation and management of chronic GERD symptoms.  The patient states she started having reflux symptoms back in 2001.  She thinks that her symptoms started shortly after her gallbladder was removed in 2000.  In 2018 she underwent gastric sleeve surgery which significantly worsened her reflux symptoms.  Because of her severe reflux symptoms, she underwent a revision to a Roux-en-Y anatomy less than a year later.  However, despite this surgery, she states her reflux symptoms did not improve.  She has been bothered by "extreme, severe" reflux symptoms for the past 4 years.  She states that she has tried "every medication" for reflux and none of them worked for her. ?She has symptoms of reflux every day, morning and night.  Her symptoms consist of burning pain in the chest and acid regurgitation.  When she eats she feels like food sits in her chest and upper abdomen.  She frequently has stomach contents, into her mouth when she bends over after meals.  Her symptoms do not seem to vary with what she eats.  She has tried numerous dietary interventions with no improvement in any of her symptoms.  Sometimes she has nausea, but no vomiting. ?Most every night, she will awaken from sleep multiple times with symptoms of acid regurgitation or heartburn. ?She is currently taking omeprazole in the mornings and then takes Mylanta at bedtime.  She takes Tums numerous times throughout the day.  She states that Zantac it worked well for her in the past, but she is no longer able to get this.  She has tried Pepcid, but she states that it did not work for her.  She has been prescribed numerous other PPIs.  She has not tried Gaviscon. ?The patient has undergone multiple upper endoscopies, most recently about 4 years ago.  She was previously  within the [facility] system, and so her medical care has been fragmented.  She thinks her last upper endoscopy was in [place].  She recalls being told she had a hiatal hernia.  She does not think she has undergone any impedance testing or Bravo probe placement. ? ?Past Medical History:  ?Diagnosis Date  ? 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 ADHD (attention deficit hyperactivity disorder)   ? Asthma   ? Crohn's disease ([place]) 2000  ? DVT (deep venous thrombosis) ([place]) 2012  ? History of hiatal hernia 2018  ? Pulmonary embolism ([place]) 2012  ? ? ? ?Past Surgical History:  ?Procedure Laterality Date  ? ABDOMINAL HYSTERECTOMY  2009  ? partial  ? CESAREAN SECTION  2002  ? x 4 (2002, 2004, 2005, 2008)  ? CHOLECYSTECTOMY  2000  ? GASTRIC BYPASS  2019  ? HARDWARE REMOVAL Left 2015  ? knee  ? KNEE SURGERY Right 2012  ? KNEE SURGERY Left 2013  ? [place] RESECTION  2018  ? TENDON RECONSTRUCTION Right 2015  ? TUBAL LIGATION  2005  ? ?Family History  ?Problem Relation Age of Onset  ? Hypertension Mother   ? Diabetes Father   ? ?Social History  ? ?Tobacco Use  ? Smoking status: Every Day  ?  Packs/day: 0.20  ?  Types: Cigarettes  ? Smokeless tobacco: Never  ?Vaping Use  ? Vaping Use: Never used  ?Substance Use Topics  ? Alcohol use: Not  Currently  ? Drug use: Never  ? ?Current Outpatient Medications  ?Medication Sig Dispense Refill  ? alum & mag hydroxide-simeth (MAALOX/MYLANTA) 200-200-20 MG/5ML suspension Take by mouth every 6 (six) hours as needed for indigestion or heartburn.    ? amphetamine-dextroamphetamine (ADDERALL) 30 MG tablet Take 1 tablet by mouth daily. 30 tablet 0  ? calcium carbonate (TUMS - DOSED IN MG ELEMENTAL CALCIUM) 500 MG chewable tablet Chew 1 tablet by mouth daily.    ? Calcium Carbonate Antacid (ALKA-SELTZER ANTACID PO) Take by mouth.    ? omeprazole (PRILOSEC) 20 MG capsule Take 1 capsule (20 mg total) by mouth daily. 90 capsule 1  ? traZODone (DESYREL) 100 MG tablet Take 1 tablet  (100 mg total) by mouth at bedtime. (Patient taking differently: Take 100 mg by mouth at bedtime as needed for sleep.) 90 tablet 1  ? ?No current facility-administered medications for this visit.  ? ?Allergies  ?Allergen Reactions  ? Morphine Shortness Of Breath  ? Nitrofurantoin Hives and Shortness Of Breath  ?  DIFFICULTY BREATHING,HIVES ?  ? Penicillins Shortness Of Breath  ?  TROUBLE BREATHING ?  ? Azithromycin   ?  Per [facility] [phone] ?  ? Cephalexin Hives  ?  Red skin  ? Ciprofloxacin Hives  ?  Red skin  ? Erythromycin Hives  ?  Red skin  ? Ketorolac Hives  ?  Red skin  ? Metronidazole Hives  ?  Red skin  ? Ondansetron   ?  Per [facility] [phone] ?  ? Other   ?  BROKE CONTRACT - [facility] NARCS FROM [facility] PER NP GRANTZ - 02-03-13 ?  ? ? ? ?Review of Systems: ?All systems reviewed and negative except where noted in HPI.  ? ? ?MR Knee Right w/o contrast ? ?Result Date: 08/30/2021 ?CLINICAL DATA:  Chronic right knee pain for 10 years. Evaluate degenerative joint disease. History of surgery twice for patellar realignment. EXAM: MRI OF THE RIGHT KNEE WITHOUT CONTRAST TECHNIQUE: Multiplanar, multisequence MR imaging of the knee was performed. No intravenous contrast was administered. COMPARISON:  Right knee radiographs 08/25/2021 FINDINGS: MENISCI Medial meniscus: There is near absence of normal meniscal tissue within the root of the anterior horn of the medial meniscus (sagittal series 7, image 16). There is intermediate proton medial meniscus density signal and mild degenerative change within the central 50% of the meniscal triangle of the root of the posterior horn of the medial meniscus (sagittal series 7, image 18). Otherwise there is no additional discrete tear extending through an articular surface of the medial meniscus. Lateral meniscus: There is mild intermediate proton density signal intrasubstance degeneration throughout the anterior horn of the lateral meniscus. There is mild truncation of the free edge of the  body of the lateral meniscus. LIGAMENTS Cruciates: The ACL and PCL are intact. Collaterals: The medial collateral ligament is intact. The fibular collateral ligament, biceps femoris tendon, iliotibial band, and popliteus tendon are intact. CARTILAGE Patellofemoral: There is full-thickness cartilage loss throughout the majority of the medial patellar facet. High-grade partial to full-thickness cartilage loss within the inferior aspect of the lateral patellar facet cartilage. There is high-grade partial and multifocal full-thickness cartilage loss throughout the lateral greater than medial trochleae. Medial: There is diffuse high-grade partial and full-thickness cartilage loss throughout the mid to medial aspect of the weight-bearing medial femoral condyle and far medial aspect of the medial tibial plateau. Lateral: High-grade partial to full-thickness cartilage loss within the lateral aspect of the weight-bearing lateral femoral condyle with  mild subchondral marrow edema and moderate peripheral degenerative osteophytes. Joint: Nojoint effusion. Normal Hoffa's fat pad. No plical thickening. Popliteal Fossa:  No Baker's cyst. Extensor Mechanism: The quadriceps tendon insertion is intact. There is again prominence of the tibial tubercle as seen on prior radiographs. There are scattered metallic densities around the tibial tubercle consistent with prior patellar realignment surgery and tibial tubercle realignment. The tibial tuberosity-trochlear groove distance measures 6 mm within normal limits. The patellar tendon is intact. There is also metallic artifact at the patellar attachment of the medial patellofemoral retinaculum consistent with prior repair. No tear is seen within the medial or lateral patellofemoral retinacula retinaculum. Bones:  No acute fracture or dislocation. Other: None. IMPRESSION:: IMPRESSION: 1. Prior patellar tendon/tibial tubercle realignment surgery. The patellar tendon is intact. 2. Moderate to  severe medial and patellofemoral compartment and mild-to-moderate lateral compartment cartilage degenerative changes. 3. Near absence of meniscal tissue within the root of the anterior horn of the medial men

## 2021-09-16 NOTE — Patient Instructions (Addendum)
If you are age 43 or older, your body mass index should be between 23-30. Your Body mass index is 35.84 kg/m?Marland Kitchen If this is out of the aforementioned range listed, please consider follow up with your Primary Care Provider. ? ?If you are age 61 or younger, your body mass index should be between 19-25. Your Body mass index is 35.84 kg/m?Marland Kitchen If this is out of the aformentioned range listed, please consider follow up with your Primary Care Provider. ? ?Start Gaviscon at bedtime.  ? ? ? ?The [facility] providers would like to encourage you to use [facility] to communicate with providers for non-urgent requests or questions.  Due to long hold times on the telephone, sending your provider a message by [facility] may be a faster and more efficient way to get a response.  Please allow 48 business hours for a response.  Please remember that this is for non-urgent requests.  ? ?It was a pleasure to see you today! ? ?Thank you for trusting me with your gastrointestinal care!   ? ?Scott E.Candis Schatz, MD  ? ?

## 2021-09-17 ENCOUNTER — Encounter: Payer: Self-pay | Admitting: Gastroenterology

## 2021-09-18 NOTE — Anesthesia Preprocedure Evaluation (Addendum)
Anesthesia Evaluation  ?Patient identified by MRN, date of birth, ID band ?Patient awake ? ? ? ?Reviewed: ?Allergy & Precautions, NPO status , Patient's Chart, lab work & pertinent test results ? ?History of Anesthesia Complications ?Negative for: history of anesthetic complications ? ?Airway ?Mallampati: II ? ?TM Distance: >3 FB ?Neck ROM: Full ? ? ? Dental ?no notable dental hx. ?(+) Dental Advisory Given ?  ?Pulmonary ?Current Smoker and Patient abstained from smoking., PE ?  ?Pulmonary exam normal ? ? ? ? ? ? ? Cardiovascular ?negative cardio ROS ?Normal cardiovascular exam ? ? ?  ?Neuro/Psych ?Acoustic neuroma: hearing loss ?negative neurological ROS ?   ? GI/Hepatic ?negative GI ROS, Neg liver ROS, Crohn's ds ?  ?Endo/Other  ?negative endocrine ROS ? Renal/GU ?negative Renal ROS  ? ?  ?Musculoskeletal ? ?(+) Arthritis ,  ? Abdominal ?  ?Peds ? Hematology ?negative hematology ROS ?(+)   ?Anesthesia Other Findings ? ? Reproductive/Obstetrics ? ?  ? ? ? ? ? ? ? ? ? ? ? ? ? ?  ?  ? ? ? ? ? ? ? ?Anesthesia Physical ?Anesthesia Plan ? ?ASA: 2 ? ?Anesthesia Plan: Spinal and MAC  ? ?Post-op Pain Management: Celebrex PO (pre-op)*, Tylenol PO (pre-op)* and Regional block*  ? ?Induction:  ? ?PONV Risk Score and Plan: 2 and Ondansetron and Propofol infusion ? ?Airway Management Planned: Natural Airway ? ?Additional Equipment:  ? ?Intra-op Plan:  ? ?Post-operative Plan:  ? ?Informed Consent: I have reviewed the patients History and Physical, chart, labs and discussed the procedure including the risks, benefits and alternatives for the proposed anesthesia with the patient or authorized representative who has indicated his/her understanding and acceptance.  ? ? ? ?Dental advisory given ? ?Plan Discussed with: Anesthesiologist and CRNA ? ?Anesthesia Plan Comments:   ? ? ? ? ? ?Anesthesia Quick Evaluation ? ?

## 2021-09-19 ENCOUNTER — Observation Stay (HOSPITAL_COMMUNITY)
Admission: RE | Admit: 2021-09-19 | Discharge: 2021-09-20 | Disposition: A | Discharge status: Home Health | Attending: Orthopaedic Surgery | Admitting: Orthopaedic Surgery

## 2021-09-19 ENCOUNTER — Observation Stay (HOSPITAL_COMMUNITY)

## 2021-09-19 ENCOUNTER — Encounter (HOSPITAL_COMMUNITY)
Admission: RE | Disposition: A | Discharge status: Home Health | Payer: Self-pay | Source: Home / Self Care | Attending: Orthopaedic Surgery

## 2021-09-19 ENCOUNTER — Encounter (HOSPITAL_COMMUNITY): Payer: Self-pay | Admitting: Orthopaedic Surgery

## 2021-09-19 ENCOUNTER — Ambulatory Visit (HOSPITAL_BASED_OUTPATIENT_CLINIC_OR_DEPARTMENT_OTHER): Admitting: Anesthesiology

## 2021-09-19 ENCOUNTER — Ambulatory Visit (HOSPITAL_COMMUNITY): Admitting: Physician Assistant

## 2021-09-19 ENCOUNTER — Other Ambulatory Visit: Payer: Self-pay | Admitting: Physician Assistant

## 2021-09-19 ENCOUNTER — Other Ambulatory Visit: Payer: Self-pay

## 2021-09-19 DIAGNOSIS — M1711 Unilateral primary osteoarthritis, right knee: Principal | ICD-10-CM | POA: Diagnosis present

## 2021-09-19 DIAGNOSIS — F17210 Nicotine dependence, cigarettes, uncomplicated: Secondary | ICD-10-CM | POA: Insufficient documentation

## 2021-09-19 DIAGNOSIS — Z86718 Personal history of other venous thrombosis and embolism: Secondary | ICD-10-CM | POA: Diagnosis not present

## 2021-09-19 DIAGNOSIS — J45909 Unspecified asthma, uncomplicated: Secondary | ICD-10-CM | POA: Insufficient documentation

## 2021-09-19 DIAGNOSIS — Z96651 Presence of right artificial knee joint: Secondary | ICD-10-CM

## 2021-09-19 HISTORY — PX: TOTAL KNEE ARTHROPLASTY: SHX125

## 2021-09-19 SURGERY — ARTHROPLASTY, KNEE, TOTAL
Anesthesia: Monitor Anesthesia Care | Site: Knee | Laterality: Right

## 2021-09-19 MED ORDER — VANCOMYCIN HCL 1000 MG IV SOLR
INTRAVENOUS | Status: DC | PRN
  Administered 2021-09-19: 1000 mg via TOPICAL

## 2021-09-19 MED ORDER — GLYCOPYRROLATE PF 0.2 MG/ML IJ SOSY
PREFILLED_SYRINGE | INTRAMUSCULAR | Status: AC
  Filled 2021-09-19: qty 1

## 2021-09-19 MED ORDER — PHENOL 1.4 % MT LIQD: 1.0000 | OROMUCOSAL | Status: DC | PRN

## 2021-09-19 MED ORDER — FENTANYL CITRATE (PF) 100 MCG/2ML IJ SOLN: 25.0000 ug | INTRAMUSCULAR | Status: DC | PRN

## 2021-09-19 MED ORDER — POVIDONE-IODINE 10 % EX SWAB
2.0000 "application " | Freq: Once | CUTANEOUS | Status: AC
  Administered 2021-09-19: 2 via TOPICAL

## 2021-09-19 MED ORDER — TRANEXAMIC ACID-NACL 1000-0.7 MG/100ML-% IV SOLN
1000.0000 mg | INTRAVENOUS | Status: AC
  Administered 2021-09-19: 1000 mg via INTRAVENOUS

## 2021-09-19 MED ORDER — CEFAZOLIN SODIUM-DEXTROSE 2-4 GM/100ML-% IV SOLN
2.0000 g | Freq: Four times a day (QID) | INTRAVENOUS | Status: AC
  Administered 2021-09-19 (×2): 2 g via INTRAVENOUS
  Filled 2021-09-19 (×2): qty 100

## 2021-09-19 MED ORDER — METHOCARBAMOL 500 MG PO TABS: 500.0000 mg | ORAL_TABLET | Freq: Two times a day (BID) | ORAL | 0 refills | Status: DC | PRN

## 2021-09-19 MED ORDER — DOCUSATE SODIUM 100 MG PO CAPS: 100.0000 mg | ORAL_CAPSULE | Freq: Every day | ORAL | 2 refills | Status: DC | PRN

## 2021-09-19 MED ORDER — LACTATED RINGERS IV SOLN: INTRAVENOUS | Status: DC

## 2021-09-19 MED ORDER — 0.9 % SODIUM CHLORIDE (POUR BTL) OPTIME
TOPICAL | Status: DC | PRN
  Administered 2021-09-19: 1000 mL

## 2021-09-19 MED ORDER — MIDAZOLAM HCL 2 MG/2ML IJ SOLN
INTRAMUSCULAR | Status: AC
  Filled 2021-09-19: qty 2

## 2021-09-19 MED ORDER — MIDAZOLAM HCL 2 MG/2ML IJ SOLN
INTRAMUSCULAR | Status: AC
  Administered 2021-09-19: 2 mg via INTRAVENOUS
  Filled 2021-09-19: qty 2

## 2021-09-19 MED ORDER — ONDANSETRON HCL 4 MG/2ML IJ SOLN: 4.0000 mg | Freq: Four times a day (QID) | INTRAMUSCULAR | Status: DC | PRN

## 2021-09-19 MED ORDER — CHLORHEXIDINE GLUCONATE 0.12 % MT SOLN
OROMUCOSAL | Status: AC
  Administered 2021-09-19: 15 mL
  Filled 2021-09-19: qty 15

## 2021-09-19 MED ORDER — IRRISEPT - 450ML BOTTLE WITH 0.05% CHG IN STERILE WATER, USP 99.95% OPTIME
TOPICAL | Status: DC | PRN
  Administered 2021-09-19: 450 mL via TOPICAL

## 2021-09-19 MED ORDER — RIVAROXABAN 10 MG PO TABS
10.0000 mg | ORAL_TABLET | Freq: Every day | ORAL | Status: DC
  Administered 2021-09-20: 10 mg via ORAL
  Filled 2021-09-19: qty 1

## 2021-09-19 MED ORDER — MIDAZOLAM HCL 2 MG/2ML IJ SOLN
2.0000 mg | Freq: Once | INTRAMUSCULAR | Status: AC
Start: 2021-09-19 — End: 2021-09-19

## 2021-09-19 MED ORDER — PROPOFOL 500 MG/50ML IV EMUL
INTRAVENOUS | Status: DC | PRN
  Administered 2021-09-19: 50 ug/kg/min via INTRAVENOUS

## 2021-09-19 MED ORDER — METOCLOPRAMIDE HCL 5 MG/ML IJ SOLN: 5.0000 mg | Freq: Three times a day (TID) | INTRAMUSCULAR | Status: DC | PRN

## 2021-09-19 MED ORDER — AMISULPRIDE (ANTIEMETIC) 5 MG/2ML IV SOLN: 10.0000 mg | Freq: Once | INTRAVENOUS | Status: DC | PRN

## 2021-09-19 MED ORDER — FENTANYL CITRATE (PF) 100 MCG/2ML IJ SOLN: 100.0000 ug | Freq: Once | INTRAMUSCULAR | Status: AC

## 2021-09-19 MED ORDER — ACETAMINOPHEN 500 MG PO TABS
1000.0000 mg | ORAL_TABLET | Freq: Four times a day (QID) | ORAL | Status: AC
  Administered 2021-09-19 – 2021-09-20 (×4): 1000 mg via ORAL
  Filled 2021-09-19 (×4): qty 2

## 2021-09-19 MED ORDER — CELECOXIB 200 MG PO CAPS
200.0000 mg | ORAL_CAPSULE | Freq: Two times a day (BID) | ORAL | Status: DC
  Administered 2021-09-19 – 2021-09-20 (×3): 200 mg via ORAL
  Filled 2021-09-19 (×3): qty 1

## 2021-09-19 MED ORDER — ALUM & MAG HYDROXIDE-SIMETH 200-200-20 MG/5ML PO SUSP
30.0000 mL | Freq: Four times a day (QID) | ORAL | Status: DC | PRN
  Administered 2021-09-19 – 2021-09-20 (×3): 30 mL via ORAL
  Filled 2021-09-19 (×3): qty 30

## 2021-09-19 MED ORDER — MENTHOL 3 MG MT LOZG: 1.0000 | LOZENGE | OROMUCOSAL | Status: DC | PRN

## 2021-09-19 MED ORDER — HYDROMORPHONE HCL 1 MG/ML IJ SOLN: 0.5000 mg | INTRAMUSCULAR | Status: DC | PRN

## 2021-09-19 MED ORDER — RIVAROXABAN 10 MG PO TABS: 10.0000 mg | ORAL_TABLET | Freq: Every day | ORAL | 0 refills | Status: DC

## 2021-09-19 MED ORDER — SODIUM CHLORIDE 0.9 % IR SOLN
Status: DC | PRN
  Administered 2021-09-19: 1000 mL

## 2021-09-19 MED ORDER — METOCLOPRAMIDE HCL 5 MG PO TABS: 5.0000 mg | ORAL_TABLET | Freq: Three times a day (TID) | ORAL | Status: DC | PRN

## 2021-09-19 MED ORDER — METHOCARBAMOL 1000 MG/10ML IJ SOLN
500.0000 mg | Freq: Four times a day (QID) | INTRAVENOUS | Status: DC | PRN
  Filled 2021-09-19: qty 5

## 2021-09-19 MED ORDER — BUPIVACAINE IN DEXTROSE 0.75-8.25 % IT SOLN
INTRATHECAL | Status: DC | PRN
  Administered 2021-09-19: 1.6 mL via INTRATHECAL

## 2021-09-19 MED ORDER — CLINDAMYCIN PHOSPHATE 900 MG/50ML IV SOLN
900.0000 mg | INTRAVENOUS | Status: AC
  Administered 2021-09-19: 900 mg via INTRAVENOUS

## 2021-09-19 MED ORDER — CLINDAMYCIN PHOSPHATE 900 MG/50ML IV SOLN
INTRAVENOUS | Status: AC
  Filled 2021-09-19: qty 50

## 2021-09-19 MED ORDER — PROPOFOL 1000 MG/100ML IV EMUL
INTRAVENOUS | Status: AC
  Filled 2021-09-19: qty 200

## 2021-09-19 MED ORDER — BUPIVACAINE-MELOXICAM ER 400-12 MG/14ML IJ SOLN
INTRAMUSCULAR | Status: AC
  Filled 2021-09-19: qty 1

## 2021-09-19 MED ORDER — ROPIVACAINE HCL 7.5 MG/ML IJ SOLN
INTRAMUSCULAR | Status: DC | PRN
  Administered 2021-09-19: 20 mL via PERINEURAL

## 2021-09-19 MED ORDER — TRANEXAMIC ACID-NACL 1000-0.7 MG/100ML-% IV SOLN
1000.0000 mg | Freq: Once | INTRAVENOUS | Status: AC
  Administered 2021-09-19: 1000 mg via INTRAVENOUS
  Filled 2021-09-19: qty 100

## 2021-09-19 MED ORDER — FENTANYL CITRATE (PF) 100 MCG/2ML IJ SOLN
INTRAMUSCULAR | Status: AC
  Administered 2021-09-19: 100 ug via INTRAVENOUS
  Filled 2021-09-19: qty 2

## 2021-09-19 MED ORDER — OXYCODONE HCL 5 MG PO TABS
5.0000 mg | ORAL_TABLET | ORAL | Status: DC | PRN
  Administered 2021-09-19 – 2021-09-20 (×3): 10 mg via ORAL
  Filled 2021-09-19 (×4): qty 2

## 2021-09-19 MED ORDER — OXYCODONE-ACETAMINOPHEN 5-325 MG PO TABS: 1.0000 | ORAL_TABLET | Freq: Four times a day (QID) | ORAL | 0 refills | Status: DC | PRN

## 2021-09-19 MED ORDER — ONDANSETRON HCL 4 MG PO TABS: 4.0000 mg | ORAL_TABLET | Freq: Four times a day (QID) | ORAL | Status: DC | PRN

## 2021-09-19 MED ORDER — ACETAMINOPHEN 325 MG PO TABS: 325.0000 mg | ORAL_TABLET | Freq: Four times a day (QID) | ORAL | Status: DC | PRN

## 2021-09-19 MED ORDER — SODIUM CHLORIDE 0.9 % IV SOLN: INTRAVENOUS | Status: DC

## 2021-09-19 MED ORDER — PHENYLEPHRINE HCL-NACL 20-0.9 MG/250ML-% IV SOLN
INTRAVENOUS | Status: DC | PRN
  Administered 2021-09-19: 40 ug/min via INTRAVENOUS

## 2021-09-19 MED ORDER — DOCUSATE SODIUM 100 MG PO CAPS
100.0000 mg | ORAL_CAPSULE | Freq: Two times a day (BID) | ORAL | Status: DC
  Administered 2021-09-19 – 2021-09-20 (×3): 100 mg via ORAL
  Filled 2021-09-19 (×3): qty 1

## 2021-09-19 MED ORDER — DEXAMETHASONE SODIUM PHOSPHATE 10 MG/ML IJ SOLN
INTRAMUSCULAR | Status: DC | PRN
Start: 2021-09-19 — End: 2021-09-19
  Administered 2021-09-19: 5 mg

## 2021-09-19 MED ORDER — GLYCOPYRROLATE 0.2 MG/ML IJ SOLN
INTRAMUSCULAR | Status: DC | PRN
  Administered 2021-09-19: .2 mg via INTRAVENOUS

## 2021-09-19 MED ORDER — VANCOMYCIN HCL 1000 MG IV SOLR
INTRAVENOUS | Status: AC
  Filled 2021-09-19: qty 20

## 2021-09-19 MED ORDER — ACETAMINOPHEN 500 MG PO TABS
1000.0000 mg | ORAL_TABLET | Freq: Once | ORAL | Status: AC
  Administered 2021-09-19: 1000 mg via ORAL
  Filled 2021-09-19: qty 2

## 2021-09-19 MED ORDER — TRANEXAMIC ACID-NACL 1000-0.7 MG/100ML-% IV SOLN
INTRAVENOUS | Status: AC
  Filled 2021-09-19: qty 100

## 2021-09-19 MED ORDER — DEXAMETHASONE SODIUM PHOSPHATE 10 MG/ML IJ SOLN
10.0000 mg | Freq: Once | INTRAMUSCULAR | Status: AC
  Administered 2021-09-20: 10 mg via INTRAVENOUS
  Filled 2021-09-19: qty 1

## 2021-09-19 MED ORDER — OXYCODONE HCL 5 MG PO TABS
10.0000 mg | ORAL_TABLET | ORAL | Status: DC | PRN
  Administered 2021-09-20 (×2): 10 mg via ORAL
  Filled 2021-09-19: qty 2

## 2021-09-19 MED ORDER — BUPIVACAINE-MELOXICAM ER 400-12 MG/14ML IJ SOLN
INTRAMUSCULAR | Status: DC | PRN
  Administered 2021-09-19: 400 mg

## 2021-09-19 MED ORDER — TRANEXAMIC ACID 1000 MG/10ML IV SOLN
INTRAVENOUS | Status: DC | PRN
  Administered 2021-09-19: 2000 mg via TOPICAL

## 2021-09-19 MED ORDER — OXYCODONE HCL ER 10 MG PO T12A
10.0000 mg | EXTENDED_RELEASE_TABLET | Freq: Two times a day (BID) | ORAL | Status: DC
  Administered 2021-09-19 – 2021-09-20 (×3): 10 mg via ORAL
  Filled 2021-09-19 (×3): qty 1

## 2021-09-19 MED ORDER — METHOCARBAMOL 500 MG PO TABS
500.0000 mg | ORAL_TABLET | Freq: Four times a day (QID) | ORAL | Status: DC | PRN
  Administered 2021-09-19 – 2021-09-20 (×4): 500 mg via ORAL
  Filled 2021-09-19 (×4): qty 1

## 2021-09-19 MED ORDER — DEXAMETHASONE SODIUM PHOSPHATE 10 MG/ML IJ SOLN
INTRAMUSCULAR | Status: DC | PRN
  Administered 2021-09-19: 5 mg via INTRAVENOUS

## 2021-09-19 MED ORDER — TRANEXAMIC ACID 1000 MG/10ML IV SOLN
2000.0000 mg | INTRAVENOUS | Status: DC
  Filled 2021-09-19: qty 20

## 2021-09-19 MED ORDER — DEXAMETHASONE SODIUM PHOSPHATE 10 MG/ML IJ SOLN
INTRAMUSCULAR | Status: AC
  Filled 2021-09-19: qty 1

## 2021-09-19 SURGICAL SUPPLY — 77 items
ALCOHOL 70% 16 OZ (MISCELLANEOUS) ×2 IMPLANT
BAG COUNTER SPONGE SURGICOUNT (BAG) IMPLANT
BAG DECANTER FOR FLEXI CONT (MISCELLANEOUS) ×2 IMPLANT
BANDAGE ESMARK 6X9 LF (GAUZE/BANDAGES/DRESSINGS) IMPLANT
BLADE SAG 18X100X1.27 (BLADE) ×2 IMPLANT
BNDG ESMARK 6X9 LF (GAUZE/BANDAGES/DRESSINGS)
BOWL SMART MIX CTS (DISPOSABLE) ×2 IMPLANT
CEMENT BONE R 1X40 (Cement) ×1 IMPLANT
CEMENT BONE REFOBACIN R1X40 US (Cement) IMPLANT
CLSR STERI-STRIP ANTIMIC 1/2X4 (GAUZE/BANDAGES/DRESSINGS) ×2 IMPLANT
COMP FEM PS KNEE NRW 7 RT (Joint) ×2 IMPLANT
COMPONENT FEM PS KNEE NRW 7 RT (Joint) IMPLANT
COOLER ICEMAN CLASSIC (MISCELLANEOUS) ×3 IMPLANT
COVER SURGICAL LIGHT HANDLE (MISCELLANEOUS) ×2 IMPLANT
CUFF TOURN SGL QUICK 34 (TOURNIQUET CUFF) ×1
CUFF TRNQT CYL 34X4.125X (TOURNIQUET CUFF) ×1 IMPLANT
DERMABOND ADVANCED (GAUZE/BANDAGES/DRESSINGS) ×1
DERMABOND ADVANCED .7 DNX12 (GAUZE/BANDAGES/DRESSINGS) ×1 IMPLANT
DRAPE EXTREMITY T 121X128X90 (DISPOSABLE) ×2 IMPLANT
DRAPE HALF SHEET 40X57 (DRAPES) ×2 IMPLANT
DRAPE INCISE IOBAN 66X45 STRL (DRAPES) ×2 IMPLANT
DRAPE ORTHO SPLIT 77X108 STRL (DRAPES) ×2
DRAPE POUCH INSTRU U-SHP 10X18 (DRAPES) ×2 IMPLANT
DRAPE SURG ORHT 6 SPLT 77X108 (DRAPES) ×2 IMPLANT
DRAPE U-SHAPE 47X51 STRL (DRAPES) ×4 IMPLANT
DRSG AQUACEL AG ADV 3.5X10 (GAUZE/BANDAGES/DRESSINGS) ×1 IMPLANT
DRSG AQUACEL AG ADV 3.5X14 (GAUZE/BANDAGES/DRESSINGS) ×1 IMPLANT
DURAPREP 26ML APPLICATOR (WOUND CARE) ×6 IMPLANT
ELECT CAUTERY BLADE 6.4 (BLADE) ×2 IMPLANT
ELECT REM PT RETURN 9FT ADLT (ELECTROSURGICAL) ×2
ELECTRODE REM PT RTRN 9FT ADLT (ELECTROSURGICAL) ×1 IMPLANT
GLOVE BIOGEL PI IND STRL 7.5 (GLOVE) ×4 IMPLANT
GLOVE BIOGEL PI INDICATOR 7.5 (GLOVE) ×4
GLOVE SURG UNDER LTX SZ7.5 (GLOVE) ×4 IMPLANT
GLOVE SURG UNDER POLY LF SZ7 (GLOVE) ×6 IMPLANT
GOWN STRL REIN XL XLG (GOWN DISPOSABLE) ×2 IMPLANT
GOWN STRL REUS W/ TWL LRG LVL3 (GOWN DISPOSABLE) ×1 IMPLANT
GOWN STRL REUS W/TWL LRG LVL3 (GOWN DISPOSABLE) ×3
HANDPIECE INTERPULSE COAX TIP (DISPOSABLE) ×1
HDLS TROCR DRIL PIN KNEE 75 (PIN) ×1
HOOD PEEL AWAY FLYTE STAYCOOL (MISCELLANEOUS) ×5 IMPLANT
JET LAVAGE IRRISEPT WOUND (IRRIGATION / IRRIGATOR) ×2
KIT BASIN OR (CUSTOM PROCEDURE TRAY) ×2 IMPLANT
KIT TURNOVER KIT B (KITS) ×2 IMPLANT
LAVAGE JET IRRISEPT WOUND (IRRIGATION / IRRIGATOR) ×1 IMPLANT
LINER ASF PERS 10X6/7 CD RT (Liner) ×1 IMPLANT
MANIFOLD NEPTUNE II (INSTRUMENTS) ×2 IMPLANT
MARKER SKIN DUAL TIP RULER LAB (MISCELLANEOUS) ×4 IMPLANT
NDL SPNL 18GX3.5 QUINCKE PK (NEEDLE) ×1 IMPLANT
NEEDLE SPNL 18GX3.5 QUINCKE PK (NEEDLE) ×2 IMPLANT
NS IRRIG 1000ML POUR BTL (IV SOLUTION) ×2 IMPLANT
PACK TOTAL JOINT (CUSTOM PROCEDURE TRAY) ×2 IMPLANT
PAD ARMBOARD 7.5X6 YLW CONV (MISCELLANEOUS) ×4 IMPLANT
PAD COLD SHLDR WRAP-ON (PAD) ×2 IMPLANT
PIN DRILL HDLS TROCAR 75 4PK (PIN) IMPLANT
SAW OSC TIP CART 19.5X105X1.3 (SAW) ×2 IMPLANT
SCREW FEMALE HEX FIX 25X2.5 (ORTHOPEDIC DISPOSABLE SUPPLIES) ×1 IMPLANT
SET HNDPC FAN SPRY TIP SCT (DISPOSABLE) ×1 IMPLANT
STAPLER VISISTAT 35W (STAPLE) ×1 IMPLANT
STEM POLY PAT PLY 32M KNEE (Knees) ×1 IMPLANT
STEM TIB PS KNEE D 0D RT (Stem) ×1 IMPLANT
SUCTION FRAZIER HANDLE 10FR (MISCELLANEOUS) ×1
SUCTION TUBE FRAZIER 10FR DISP (MISCELLANEOUS) ×1 IMPLANT
SUT ETHILON 2 0 FS 18 (SUTURE) IMPLANT
SUT MNCRL AB 3-0 PS2 27 (SUTURE) IMPLANT
SUT VIC AB 0 CT1 27 (SUTURE) ×2
SUT VIC AB 0 CT1 27XBRD ANBCTR (SUTURE) ×2 IMPLANT
SUT VIC AB 1 CTX 27 (SUTURE) ×6 IMPLANT
SUT VIC AB 2-0 CT1 27 (SUTURE) ×6
SUT VIC AB 2-0 CT1 36 (SUTURE) ×1 IMPLANT
SUT VIC AB 2-0 CT1 TAPERPNT 27 (SUTURE) ×4 IMPLANT
SYR 50ML LL SCALE MARK (SYRINGE) ×4 IMPLANT
TOWEL GREEN STERILE (TOWEL DISPOSABLE) ×2 IMPLANT
TOWEL GREEN STERILE FF (TOWEL DISPOSABLE) ×2 IMPLANT
TRAY FOLEY MTR SLVR 16FR STAT (SET/KITS/TRAYS/PACK) ×1 IMPLANT
UNDERPAD 30X36 HEAVY ABSORB (UNDERPADS AND DIAPERS) ×2 IMPLANT
YANKAUER SUCT BULB TIP NO VENT (SUCTIONS) ×4 IMPLANT

## 2021-09-19 NOTE — TOC Progression Note (Signed)
Transition of Care (TOC) - Progression Note  ? ? ?Patient Details  ?Name: Sophia Long ?MRN: [id number] ?Date of Birth: [birth year] ? ?Transition of Care (TOC) CM/SW Contact  ?Marilu Favre, RN ?Phone Number: ?09/19/2021, 12:10 PM ? ?Clinical Narrative:    ? ?Dr Erlinda Hong office has arranged home health services with [facility].  ? ?3C staff will provide any needed DME  ? ?  ?Transition of Care (TOC) Screening Note ? ? ?Patient Details  ?Name: Sophia Long ?Date of Birth: [birth year] ? ? ? ?Transition of Care Department [facility]) has reviewed patient and no TOC needs have been identified at this time. We will continue to monitor patient advancement through interdisciplinary progression rounds. If new patient transition needs arise, please place a TOC consult. ?  ?  ? ?Expected Discharge Plan and Services ?  ?  ?  ?  ?  ?                ?  ?  ?  ?  ?  ?  ?  ?  ?  ?  ? ? ?Social Determinants of Health (SDOH) Interventions ?  ? ?Readmission Risk Interventions ?   ? View : No data to display.  ?  ?  ?  ? ? ?

## 2021-09-19 NOTE — Evaluation (Signed)
Physical Therapy Evaluation ?Patient Details ?Name: Sophia Long ?MRN: [id number] ?DOB: [birth year] ?Today's Date: 09/19/2021 ? ?History of Present Illness ? Pt is a 41 y/o female s/p R TKA. PMH includes PE/DVT, R knee surgery, acoustic neuroma and Chron's disease.  ?Clinical Impression ? Pt is s/p surgery above with deficits below. Tolerated ambulation well using RW. Requiring min to min guard with mobility tasks. Educated about knee precautions. Will continue to follow acutely.    ?   ? ?Recommendations for follow up therapy are one component of a multi-disciplinary discharge planning process, led by the attending physician.  Recommendations may be updated based on patient status, additional functional criteria and insurance authorization. ? ?Follow Up Recommendations Follow physician's recommendations for discharge plan and follow up therapies ? ?  ?Assistance Recommended at Discharge Intermittent Supervision/Assistance  ?Patient can return home with the following ? Help with stairs or ramp for entrance;Assist for transportation;Assistance with cooking/housework ? ?  ?Equipment Recommendations None recommended by PT  ?Recommendations for Other Services ?    ?  ?Functional Status Assessment Patient has had a recent decline in their functional status and demonstrates the ability to make significant improvements in function in a reasonable and predictable amount of time.  ? ?  ?Precautions / Restrictions Precautions ?Precautions: Knee ?Precaution Booklet Issued: No ?Precaution Comments: Verbally reviewed knee precautions. ?Restrictions ?Weight Bearing Restrictions: Yes ?RLE Weight Bearing: Weight bearing as tolerated  ? ?  ? ?Mobility ? Bed Mobility ?Overal bed mobility: Needs Assistance ?Bed Mobility: Supine to Sit ?  ?  ?Supine to sit: Min assist ?  ?  ?General bed mobility comments: Assist for RLE management ?  ? ?Transfers ?Overall transfer level: Needs assistance ?Equipment used: Rolling walker (2 wheels) ?Transfers:  Sit to/from Stand ?Sit to Stand: Min guard ?  ?  ?  ?  ?  ?General transfer comment: Min guard for safety. Cues for hand placement. ?  ? ?Ambulation/Gait ?Ambulation/Gait assistance: Min guard ?Gait Distance (Feet): 50 Feet ?Assistive device: Rolling walker (2 wheels) ?Gait Pattern/deviations: Step-to pattern, Decreased step length - right, Decreased step length - left, Decreased weight shift to right, Antalgic ?Gait velocity: Decreased ?  ?  ?General Gait Details: Slow, antalgic gait. Min guard for safety. Cues for sequencing. ? ?Stairs ?  ?  ?  ?  ?  ? ?Wheelchair Mobility ?  ? ?Modified Rankin (Stroke Patients Only) ?  ? ?  ? ?Balance Overall balance assessment: Needs assistance ?Sitting-balance support: No upper extremity supported, Feet supported ?Sitting balance-Leahy Scale: Fair ?  ?  ?Standing balance support: Bilateral upper extremity supported ?Standing balance-Leahy Scale: Poor ?Standing balance comment: Reliant on BUE support ?  ?  ?  ?  ?  ?  ?  ?  ?  ?  ?  ?   ? ? ? ?Pertinent Vitals/Pain Pain Assessment ?Pain Assessment: Faces ?Faces Pain Scale: Hurts even more ?Pain Location: R knee ?Pain Descriptors / Indicators: Burning, Grimacing, Guarding ?Pain Intervention(s): Monitored during session, Limited activity within patient's tolerance, Repositioned  ? ? ?Home Living Family/patient expects to be discharged to:: Private residence ?Living Arrangements: Spouse/significant other;Children ?Available Help at Discharge: Family ?Type of Home: House ?Home Access: Stairs to enter ?Entrance Stairs-Rails: None ?Entrance Stairs-Number of Steps: 1 (porch step) ?  ?Home Layout: One level ?Home Equipment: Conservation officer, nature (2 wheels);Cane - single point;BSC/3in1 ?   ?  ?Prior Function Prior Level of Function : Independent/Modified Independent ?  ?  ?  ?  ?  ?  ?  ?  ?  ? ? ?  Hand Dominance  ?   ? ?  ?Extremity/Trunk Assessment  ? Upper Extremity Assessment ?Upper Extremity Assessment: Defer to OT evaluation ?  ? ?Lower  Extremity Assessment ?Lower Extremity Assessment: RLE deficits/detail ?RLE Deficits / Details: Deficits consistent with post op pain and weakness. ?  ? ?Cervical / Trunk Assessment ?Cervical / Trunk Assessment: Normal  ?Communication  ? Communication: No difficulties  ?Cognition Arousal/Alertness: Awake/alert ?Behavior During Therapy: [facility] for tasks assessed/performed ?Overall Cognitive Status: Within Functional Limits for tasks assessed ?  ?  ?  ?  ?  ?  ?  ?  ?  ?  ?  ?  ?  ?  ?  ?  ?  ?  ?  ? ?  ?General Comments   ? ?  ?Exercises Total Joint Exercises ?Ankle Circles/Pumps: AROM, Both, 5 reps, Seated  ? ?Assessment/Plan  ?  ?PT Assessment Patient needs continued PT services  ?PT Problem List Decreased strength;Decreased range of motion;Decreased balance;Decreased activity tolerance;Decreased mobility;Decreased knowledge of use of DME;Decreased knowledge of precautions;Pain ? ?   ?  ?PT Treatment Interventions DME instruction;Gait training;Stair training;Functional mobility training;Therapeutic activities;Therapeutic exercise;Balance training;Patient/family education   ? ?PT Goals (Current goals can be found in the Care Plan section)  ?Acute Rehab PT Goals ?Patient Stated Goal: to go home ?PT Goal Formulation: With patient ?Time For Goal Achievement: 10/03/21 ?Potential to Achieve Goals: Good ? ?  ?Frequency 7X/week ?  ? ? ?Co-evaluation   ?  ?  ?  ?  ? ? ?  ?AM-PAC PT "6 Clicks" Mobility  ?Outcome Measure Help needed turning from your back to your side while in a flat bed without using bedrails?: None ?Help needed moving from lying on your back to sitting on the side of a flat bed without using bedrails?: A Little ?Help needed moving to and from a bed to a chair (including a wheelchair)?: A Little ?Help needed standing up from a chair using your arms (e.g., wheelchair or bedside chair)?: A Little ?Help needed to walk in hospital room?: A Little ?Help needed climbing 3-5 steps with a railing? : A Lot ?6 Click  Score: 18 ? ?  ?End of Session Equipment Utilized During Treatment: Gait belt ?Activity Tolerance: Patient tolerated treatment well ?Patient left: in chair;with call bell/phone within reach ?Nurse Communication: Mobility status ?PT Visit Diagnosis: Other abnormalities of gait and mobility (R26.89);Pain ?Pain - Right/Left: Right ?Pain - part of body: Knee ?  ? ?Time: 5809-9833 ?PT Time Calculation (min) (ACUTE ONLY): 23 min ? ? ?Charges:   PT Evaluation ?$PT Eval Low Complexity: 1 Low ?PT Treatments ?$Gait Training: 8-22 mins ?  ?   ? ? ?Reuel Derby, PT, DPT  ?Acute Rehabilitation Services  ?Pager: [phone] ?Office: [phone] ? ? ?[place] ?09/19/2021, 2:53 PM ? ?

## 2021-09-19 NOTE — Anesthesia Postprocedure Evaluation (Signed)
Anesthesia Post Note ? ?Patient: Sophia Long ? ?Procedure(s) Performed: RIGHT TOTAL KNEE ARTHROPLASTY (Right: Knee) ? ?  ? ?Patient location during evaluation: PACU ?Anesthesia Type: MAC ?Level of consciousness: awake and alert ?Pain management: pain level controlled ?Vital Signs Assessment: post-procedure vital signs reviewed and stable ?Respiratory status: spontaneous breathing, nonlabored ventilation and respiratory function stable ?Cardiovascular status: blood pressure returned to baseline and stable ?Postop Assessment: no apparent nausea or vomiting ?Anesthetic complications: no ? ? ?No notable events documented. ? ?Last Vitals:  ?Vitals:  ? 09/19/21 1200 09/19/21 1218  ?BP: 113/78 123/89  ?Pulse: (!) 50 (!) 49  ?Resp: 13 18  ?Temp:    ?SpO2: 95% 100%  ?  ?Last Pain:  ?Vitals:  ? 09/19/21 1218  ?TempSrc:   ?PainSc: 0-No pain  ? ? ?  ?  ?  ?  ?  ?  ? ?Lynda Rainwater ? ? ? ? ?

## 2021-09-19 NOTE — Discharge Instructions (Signed)

## 2021-09-19 NOTE — H&P (Signed)
? ? ?PREOPERATIVE H&P ? ?Chief Complaint: right knee degenerative joint disease ? ?HPI: ?Sophia Long is a 41 y.o. female who presents for surgical treatment of right knee degenerative joint disease.  She denies any changes in medical history. ? ?Past Medical History:  ?Diagnosis Date  ? Acoustic neuroma [facility]) 2013  ? ADHD (attention deficit hyperactivity disorder)   ? Asthma   ? Crohn's disease ([place]) 2000  ? DVT (deep venous thrombosis) ([place]) 2012  ? History of hiatal hernia 2018  ? Pulmonary embolism ([place]) 2012  ? ?Past Surgical History:  ?Procedure Laterality Date  ? ABDOMINAL HYSTERECTOMY  2009  ? partial  ? CESAREAN SECTION  2002  ? x 4 (2002, 2004, 2005, 2008)  ? CHOLECYSTECTOMY  2000  ? GASTRIC BYPASS  2019  ? HARDWARE REMOVAL Left 2015  ? knee  ? KNEE SURGERY Right 2012  ? KNEE SURGERY Left 2013  ? [place] RESECTION  2018  ? TENDON RECONSTRUCTION Right 2015  ? TUBAL LIGATION  2005  ? ?Social History  ? ?Socioeconomic History  ? Marital status: Married  ?  Spouse name: Saralyn Pilar  ? Number of children: 6  ? Years of education: Not on file  ? Highest education level: Not on file  ?Occupational History  ? Not on file  ?Tobacco Use  ? Smoking status: Every Day  ?  Packs/day: 0.20  ?  Types: Cigarettes  ? Smokeless tobacco: Never  ?Vaping Use  ? Vaping Use: Never used  ?Substance and Sexual Activity  ? Alcohol use: Not Currently  ? Drug use: Never  ? Sexual activity: Yes  ?Other Topics Concern  ? Not on file  ?Social History Narrative  ? Not on file  ? ?Social Determinants of Health  ? ?Financial Resource Strain: Not on file  ?Food Insecurity: Not on file  ?Transportation Needs: Not on file  ?Physical Activity: Not on file  ?Stress: Not on file  ?Social Connections: Not on file  ? ?Family History  ?Problem Relation Age of Onset  ? Hypertension Mother   ? Diabetes Father   ? Colon cancer Maternal Uncle   ? Stomach cancer Maternal Uncle   ? ?Allergies  ?Allergen Reactions  ? Morphine Shortness Of  Breath  ? Nitrofurantoin Hives and Shortness Of Breath  ?  DIFFICULTY BREATHING,HIVES ?  ? Penicillins Shortness Of Breath  ?  TROUBLE BREATHING ?  ? Azithromycin   ?  Per [facility] [phone] ?  ? Cephalexin Hives  ?  Red skin  ? Ciprofloxacin Hives  ?  Red skin  ? Erythromycin Hives  ?  Red skin  ? Ketorolac Hives  ?  Red skin  ? Metronidazole Hives  ?  Red skin  ? Ondansetron   ?  Per [facility] [phone] ?  ? Other   ?  BROKE CONTRACT - [facility] PER NP GRANTZ - 02-03-13 ?  ? ?Prior to Admission medications   ?Medication Sig Start Date End Date Taking? Authorizing Provider  ?amphetamine-dextroamphetamine (ADDERALL) 30 MG tablet Take 1 tablet by mouth daily. 08/22/21  Yes Dorna Mai, MD  ?omeprazole (PRILOSEC) 20 MG capsule Take 1 capsule (20 mg total) by mouth daily. 08/22/21  Yes Dorna Mai, MD  ?traZODone (DESYREL) 100 MG tablet Take 1 tablet (100 mg total) by mouth at bedtime. ?Patient taking differently: Take 100 mg by mouth at bedtime as needed for sleep. 08/22/21  Yes Dorna Mai, MD  ?alum & mag hydroxide-simeth (MAALOX/MYLANTA) 200-200-20 MG/5ML  suspension Take by mouth every 6 (six) hours as needed for indigestion or heartburn.    [provider]  ?calcium carbonate (TUMS - DOSED IN MG ELEMENTAL CALCIUM) 500 MG chewable tablet Chew 1 tablet by mouth daily.    [provider]  ?Calcium Carbonate Antacid (ALKA-SELTZER ANTACID PO) Take by mouth.    [provider]  ? ? ? ?Positive ROS: All other systems have been reviewed and were otherwise negative with the exception of those mentioned in the HPI and as above. ? ?Physical Exam: ?General: Alert, no acute distress ?Cardiovascular: No pedal edema ?Respiratory: No cyanosis, no use of accessory musculature ?GI: abdomen soft ?Skin: No lesions in the area of chief complaint ?Neurologic: Sensation intact distally ?Psychiatric: Patient is competent for consent with normal mood and affect ?Lymphatic: no  lymphedema ? ?MUSCULOSKELETAL: exam stable ? ?Assessment: ?right knee degenerative joint disease ? ?Plan: ?Plan for Procedure(s): ?RIGHT TOTAL KNEE ARTHROPLASTY ? ?The risks benefits and alternatives were discussed with the patient including but not limited to the risks of nonoperative treatment, versus surgical intervention including infection, bleeding, nerve injury,  blood clots, cardiopulmonary complications, morbidity, mortality, among others, and they were willing to proceed.  ? ?Preoperative templating of the joint replacement has been completed, documented, and submitted to the Operating Room personnel in order to optimize intra-operative equipment management. ? ? ?Eduard Roux, MD ?09/19/2021 ?6:59 AM ? ?

## 2021-09-19 NOTE — Op Note (Addendum)
? ?Total Knee Arthroplasty Procedure Note ? ?Preoperative diagnosis:  ?Right knee osteoarthritis ?Status post right knee MPFL reconstruction with 2 embedded anchors ? ?Postoperative diagnosis:same ? ?Operative procedure:  ?Right total knee arthroplasty. CPT [phone] ?Removal of 2 deep anchors from patella ? ?Surgeon: N. Eduard Roux, MD ? ?Assist: Madalyn Rob, PA-C; necessary for the timely completion of procedure and due to complexity of procedure. ? ?Anesthesia: Spinal, regional ? ?Tourniquet time: 45 mins ? ?Implants used: Zimmer persona ?Femur: CR 7 narrow pressfit ?Tibia: D pressfit ?Patella: 32 mm cemented ?Polyethylene: 10 mm, MC ? ?Indication: ?Sophia Long is a 41 y.o. year old female with a history of knee pain. Having failed conservative management, the patient elected to proceed with a total knee arthroplasty.  We have reviewed the risk and benefits of the surgery and they elected to proceed after voicing understanding. ? ?Procedure: ? ?After informed consent was obtained and understanding of the risk were voiced including but not limited to bleeding, infection, damage to surrounding structures including nerves and vessels, blood clots, leg length inequality and the failure to achieve desired results, the operative extremity was marked with verbal confirmation of the patient in the holding area.   ?The patient was then brought to the operating room and transported to the operating room table in the supine position.  A tourniquet was applied to the operative extremity around the upper thigh. The operative limb was then prepped and draped in the usual sterile fashion and preoperative antibiotics were administered.  A time out was performed prior to the start of surgery confirming the correct extremity, preoperative antibiotic administration, as well as team members, implants and instruments available for the case. Correct surgical site was also confirmed with preoperative radiographs. ?The limb was  then elevated for exsanguination and the tourniquet was inflated. A midline incision was made and a standard medial parapatellar approach was performed.  The patella was prepared and sized to a 32 mm.  The bone quality was not great due to prior surgery.  Two anchors from the prior MPFL reconstruction were removed with a rongeur from the patella.  There were no defects in the bone fortunately.  A cover was placed on the patella for protection from retractors.  We then turned our attention to the femur. Posterior cruciate ligament was sacrificed. Start site was drilled in the femur and the intramedullary distal femoral cutting guide was placed, set at 5 degrees valgus, taking 10 mm of distal resection. The distal cut was made. Osteophytes were then removed.   ?Next, the proximal tibial cutting guide was placed with appropriate slope, varus/valgus alignment and depth of resection. The proximal tibial cut was made taking 4 mm off the low side. Gap blocks were then used to assess the extension gap and alignment, and appropriate soft tissue releases were performed. ?Attention was turned back to the femur, which was sized using the sizing guide to a size 7 narrow. Appropriate rotation of the femoral component was determined using epicondylar axis, Whiteside?s line, and assessing the flexion gap under ligament tension. The appropriate size 4-in-1 cutting block was placed and cuts were made.  Posterior femoral osteophytes and uncapped bone were then removed with the curved osteotome.  Trial components were placed, and stability was checked in full extension, mid-flexion, and deep flexion. Proper tibial rotation was determined and marked.  The patella tracked well without a lateral release. ?Trial components were then removed and tibial preparation performed.  The tibia was sized for a size D  component.  ?The bony surfaces were irrigated with a pulse lavage and then dried. The stability of the construct was re-evaluated  throughout a range of motion and found to be acceptable. The trial liner was removed, the knee was copiously irrigated, and the knee was re-evaluated for any excess bone debris. The real polyethylene liner, 10 mm thick, was inserted and checked to ensure the locking mechanism had engaged appropriately. The patellar component was cemented into place and excess cement was removed.  Clamp was applied and cemented allowed to fully harden.  The tourniquet was deflated and hemostasis was achieved. The wound was irrigated with normal saline.  One gram of vancomycin powder was placed in the surgical bed.  Topical 0.25% bupivacaine and meloxicam was placed in the joint for postoperative pain.  Capsular closure was performed with a #1 vicryl, subcutaneous fat closed with a 0 vicryl suture, then subcutaneous tissue closed with interrupted 2.0 vicryl suture. The skin was then closed with a 2.0 nylon and dermabond. A sterile dressing was applied.  The patient was awakened in the operating room and taken to recovery in stable condition. All sponge, needle, and instrument counts were correct at the end of the case. ? ?Sophia Long was necessary for opening, closing, retracting, limb positioning and overall facilitation and completion of the surgery. ? ?Position: supine  ?Complications: none.  ?Time Out: performed  ? ?Drains/Packing: none ? ?Estimated blood loss: minimal ? ?Returned to Recovery Room: in good condition.  ? ?Antibiotics: yes  ? ?Mechanical VTE (DVT) Prophylaxis: sequential compression devices, TED thigh-high  ?Chemical VTE (DVT) Prophylaxis: xarelto ? ?Fluid Replacement  ?Crystalloid: see anesthesia record ?Blood: none  ?FFP: none  ? ?Specimens Removed: 1 to pathology  ? ?Sponge and Instrument Count Correct? yes  ? ?PACU: portable radiograph - knee AP and Lateral  ? ?Plan/RTC: Return in 2 weeks for wound check.  ? ?Weight Bearing/Load Lower Extremity: full  ? ?Implant Name Type Inv. Item Serial No. Manufacturer  Lot No. LRB No. Used Action  ?COMP FEM PS KNEE NRW 7 RT - [id number] Joint COMP FEM PS KNEE NRW 7 RT  ZIMMER [employer]ON(ORTH,TRAU,BIO,SG) [id number] Right 1 Implanted  ?CEMENT BONE R 1X40 - [id number] Cement CEMENT BONE R 1X40  ZIMMER [employer]ON(ORTH,TRAU,BIO,SG) [id number] Right 1 Implanted  ?STEM POLY PAT PLY 56M KNEE - [id number] Knees STEM POLY PAT PLY 56M KNEE  ZIMMER [employer]ON(ORTH,TRAU,BIO,SG) [id number] Right 1 Implanted  ?STEM TIB PS KNEE D 0D RT - [id number] Stem STEM TIB PS KNEE D 0D RT  ZIMMER [employer]ON(ORTH,TRAU,BIO,SG) [id number] Right 1 Implanted  ?LINER ASF PERS 10X6/7 CD RT - [id number] Liner LINER ASF PERS 10X6/7 CD RT  ZIMMER [employer]ON(ORTH,TRAU,BIO,SG) [id number] Right 1 Implanted  ? ? ?N. Eduard Roux, MD ?Marga Hoots ?10:20 AM ? ? ?

## 2021-09-19 NOTE — Anesthesia Procedure Notes (Signed)
Spinal ? ?Patient location during procedure: OR ?Start time: 09/19/2021 8:53 AM ?End time: 09/19/2021 9:02 AM ?Reason for block: surgical anesthesia ?Staffing ?Performed: anesthesiologist  ?Anesthesiologist: Duane Boston, MD ?Preanesthetic Checklist ?Completed: patient identified, IV checked, risks and benefits discussed, surgical consent, monitors and equipment checked, pre-op evaluation and timeout performed ?Spinal Block ?Patient position: sitting ?Prep: DuraPrep ?Patient monitoring: cardiac monitor, continuous pulse ox and blood pressure ?Approach: midline ?Location: L2-3 ?Injection technique: single-shot ?Needle ?Needle type: Pencan  ?Needle gauge: 24 G ?Needle length: 9 cm ?Assessment ?Events: CSF return ?Additional Notes ?Functioning IV was confirmed and monitors were applied. Sterile prep and drape, including hand hygiene and sterile gloves were used. The patient was positioned and the spine was prepped. The skin was anesthetized with lidocaine.  Free flow of clear CSF was obtained prior to injecting local anesthetic into the CSF.  The spinal needle aspirated freely following injection.  The needle was carefully withdrawn.  The patient tolerated the procedure well.  ? ? ? ?

## 2021-09-19 NOTE — Transfer of Care (Signed)
Immediate Anesthesia Transfer of Care Note ? ?Patient: Leyanna Bittman ? ?Procedure(s) Performed: RIGHT TOTAL KNEE ARTHROPLASTY (Right: Knee) ? ?Patient Location: PACU ? ?Anesthesia Type:MAC and Spinal ? ?Level of Consciousness: awake, alert  and oriented ? ?Airway & Oxygen Therapy: Patient Spontanous Breathing ? ?Post-op Assessment: Report given to RN, Post -op Vital signs reviewed and stable and Patient able to stick tongue midline ? ?Post vital signs: Reviewed ? ?Last Vitals:  ?Vitals Value Taken Time  ?BP 127/83   ?Temp 97.4   ?Pulse 80 09/19/21 1100  ?Resp 27 09/19/21 1100  ?SpO2 88 % 09/19/21 1100  ?Vitals shown include unvalidated device data. ? ?Last Pain:  ?Vitals:  ? 09/19/21 0750  ?TempSrc:   ?PainSc: 0-No pain  ?   ? ?  ? ?Complications: No notable events documented. ?

## 2021-09-19 NOTE — Anesthesia Procedure Notes (Signed)
Anesthesia Regional Block: Popliteal block  ? ?Pre-Anesthetic Checklist: , timeout performed,  Correct Patient, Correct Site, Correct Laterality,  Correct Procedure, Correct Position, site marked,  Risks and benefits discussed,  Surgical consent,  Pre-op evaluation,  At surgeon's request and post-op pain management ? ?Laterality: Right ? ?Prep: chloraprep     ?  ?Needles:  ?Injection technique: Single-shot ? ?Needle Type: Echogenic Stimulator Needle   ? ? ? ? ? ? ? ?Additional Needles: ? ? ?Procedures:,,,, ultrasound used (permanent image in chart),,    ?Narrative:  ?Start time: 09/19/2021 8:14 AM ?End time: 09/19/2021 8:23 AM ?Injection made incrementally with aspirations every 5 mL. ? ?Performed by: Personally  ?Anesthesiologist: Duane Boston, MD ? ?Additional Notes: ?A functioning IV was confirmed and monitors were applied.  Sterile prep and drape, hand hygiene and sterile gloves were used.  Negative aspiration and test dose prior to incremental administration of local anesthetic. The patient tolerated the procedure well.Ultrasound  guidance: relevant anatomy identified, needle position confirmed, local anesthetic spread visualized around nerve(s), vascular puncture avoided.  Image printed for medical record.  ? ? ? ? ?

## 2021-09-19 NOTE — TOC Progression Note (Deleted)
Transition of Care (TOC) - Progression Note  ? ? ?Patient Details  ?Name: Sophia Long ?MRN: [id number] ?Date of Birth: 05/30/1981 ? ?Transition of Care (TOC) CM/SW Contact  ?Marilu Favre, RN ?Phone Number: ?09/19/2021, 2:09 PM ? ?Clinical Narrative:    ? ? ?DR Erlinda Hong office has arranged home health services with [facility].  ?  ?3[facility] staff will provide any needed DME  ? ? ?Transition of Care (TOC) Screening Note ? ? ?Patient Details  ?Name: Sophia Long ?Date of Birth: 06/30/1980 ? ? ?Transition of Care Department [facility]) has reviewed patient and no TOC needs have been identified at this time. We will continue to monitor patient advancement through interdisciplinary progression rounds. If new patient transition needs arise, please place a TOC consult. ?  ?  ?  ? ?Expected Discharge Plan and Services ?  ?  ?  ?  ?  ?                ?  ?  ?  ?  ?  ?  ?  ?  ?  ?  ? ? ?Social Determinants of Health (SDOH) Interventions ?  ? ?Readmission Risk Interventions ?   ? View : No data to display.  ?  ?  ?  ? ? ?

## 2021-09-20 ENCOUNTER — Other Ambulatory Visit: Payer: Self-pay | Admitting: Physician Assistant

## 2021-09-20 DIAGNOSIS — M1711 Unilateral primary osteoarthritis, right knee: Secondary | ICD-10-CM | POA: Diagnosis not present

## 2021-09-20 LAB — CBC
HCT: 32.8 % — ABNORMAL LOW (ref 36.0–46.0)
Hemoglobin: 10.5 g/dL — ABNORMAL LOW (ref 12.0–15.0)
MCH: 29 pg (ref 26.0–34.0)
MCHC: 32 g/dL (ref 30.0–36.0)
MCV: 90.6 fL (ref 80.0–100.0)
Platelets: 246 10*3/uL (ref 150–400)
RBC: 3.62 MIL/uL — ABNORMAL LOW (ref 3.87–5.11)
RDW: 17.2 % — ABNORMAL HIGH (ref 11.5–15.5)
WBC: 15 10*3/uL — ABNORMAL HIGH (ref 4.0–10.5)
nRBC: 0 % (ref 0.0–0.2)

## 2021-09-20 LAB — BASIC METABOLIC PANEL
Anion gap: 6 (ref 5–15)
BUN: 10 mg/dL (ref 6–20)
CO2: 26 mmol/L (ref 22–32)
Calcium: 8.6 mg/dL — ABNORMAL LOW (ref 8.9–10.3)
Chloride: 109 mmol/L (ref 98–111)
Creatinine, Ser: 0.76 mg/dL (ref 0.44–1.00)
GFR, Estimated: >60 mL/min (ref >60)
Glucose, Bld: 132 mg/dL — ABNORMAL HIGH (ref 70–99)
Potassium: 4.6 mmol/L (ref 3.5–5.1)
Sodium: 141 mmol/L (ref 135–145)

## 2021-09-20 NOTE — Progress Notes (Signed)
Physical Therapy Treatment ?Patient Details ?Name: Sophia Long ?MRN: [id number] ?DOB: [birth year] ?Today's Date: 09/20/2021 ? ? ?History of Present Illness Pt is a 41 y/o female s/p R TKA. PMH includes PE/DVT, R knee surgery, acoustic neuroma and Chron's disease. ? ?  ?PT Comments  ? ? Pt received in bed with c/o posterior knee pain on R. She demonstrates mod I bed mobility. Supervision transfers, and min guard assist ambulation 200' with RW. Pt returned to bed at end of session. Pain meds requested.  PT to return for second session to review HEP, stairs. ? ?   ?Recommendations for follow up therapy are one component of a multi-disciplinary discharge planning process, led by the attending physician.  Recommendations may be updated based on patient status, additional functional criteria and insurance authorization. ? ?Follow Up Recommendations ? Follow physician's recommendations for discharge plan and follow up therapies ?  ?  ?Assistance Recommended at Discharge Intermittent Supervision/Assistance  ?Patient can return home with the following Help with stairs or ramp for entrance;Assist for transportation;Assistance with cooking/housework ?  ?Equipment Recommendations ? None recommended by PT  ?  ?Recommendations for Other Services   ? ? ?  ?Precautions / Restrictions Precautions ?Precautions: Knee ?Precaution Comments: Verbally reviewed knee precautions. ?Restrictions ?RLE Weight Bearing: Weight bearing as tolerated  ?  ? ?Mobility ? Bed Mobility ?Overal bed mobility: Modified Independent ?  ?  ?  ?  ?  ?  ?General bed mobility comments: HOB elevated, +rail, using LLE to assist RLE back to bed ?  ? ?Transfers ?Overall transfer level: Needs assistance ?Equipment used: Rolling walker (2 wheels) ?Transfers: Sit to/from Stand ?Sit to Stand: Supervision ?  ?  ?  ?  ?  ?General transfer comment: supervision for safety ?  ? ?Ambulation/Gait ?Ambulation/Gait assistance: Min guard ?Gait Distance (Feet): 200 Feet ?Assistive  device: Rolling walker (2 wheels) ?Gait Pattern/deviations: Step-through pattern, Decreased stride length ?Gait velocity: Decreased ?  ?  ?General Gait Details: cues for heel strike and toe off RLE ? ? ?Stairs ?  ?  ?  ?  ?  ? ? ?Wheelchair Mobility ?  ? ?Modified Rankin (Stroke Patients Only) ?  ? ? ?  ?Balance Overall balance assessment: Needs assistance ?Sitting-balance support: No upper extremity supported, Feet supported ?Sitting balance-Leahy Scale: Good ?  ?  ?Standing balance support: Bilateral upper extremity supported, During functional activity, No upper extremity supported ?Standing balance-Leahy Scale: Fair ?Standing balance comment: static stand without UE support ?  ?  ?  ?  ?  ?  ?  ?  ?  ?  ?  ?  ? ?  ?Cognition Arousal/Alertness: Awake/alert ?Behavior During Therapy: [facility] for tasks assessed/performed ?Overall Cognitive Status: Within Functional Limits for tasks assessed ?  ?  ?  ?  ?  ?  ?  ?  ?  ?  ?  ?  ?  ?  ?  ?  ?  ?  ?  ? ?  ?Exercises Total Joint Exercises ?Ankle Circles/Pumps: AROM, Both, 10 reps, Supine ?Quad Sets: AROM, Right, 5 reps, Supine ? ?  ?General Comments   ?  ?  ? ?Pertinent Vitals/Pain Pain Assessment ?Pain Assessment: 0-10 ?Pain Score: 7  ?Pain Location: R knee ?Pain Descriptors / Indicators: Tightness, Discomfort, Grimacing, Guarding ?Pain Intervention(s): Monitored during session, Repositioned, Ice applied, Patient requesting pain meds-RN notified  ? ? ?Home Living   ?  ?  ?  ?  ?  ?  ?  ?  ?  ?   ?  ?  Prior Function    ?  ?  ?   ? ?PT Goals (current goals can now be found in the care plan section) Acute Rehab PT Goals ?Patient Stated Goal: home ?Progress towards PT goals: Progressing toward goals ? ?  ?Frequency ? ? ? 7X/week ? ? ? ?  ?PT Plan Current plan remains appropriate  ? ? ?Co-evaluation   ?  ?  ?  ?  ? ?  ?AM-PAC PT "6 Clicks" Mobility   ?Outcome Measure ? Help needed turning from your back to your side while in a flat bed without using bedrails?: None ?Help needed  moving from lying on your back to sitting on the side of a flat bed without using bedrails?: A Little ?Help needed moving to and from a bed to a chair (including a wheelchair)?: A Little ?Help needed standing up from a chair using your arms (e.g., wheelchair or bedside chair)?: A Little ?Help needed to walk in hospital room?: A Little ?Help needed climbing 3-5 steps with a railing? : A Little ?6 Click Score: 19 ? ?  ?End of Session Equipment Utilized During Treatment: Gait belt ?Activity Tolerance: Patient tolerated treatment well ?Patient left: in bed;with call bell/phone within reach ?Nurse Communication: Mobility status ?PT Visit Diagnosis: Other abnormalities of gait and mobility (R26.89);Pain ?Pain - Right/Left: Right ?Pain - part of body: Knee ?  ? ? ?Time: 5110-2111 ?PT Time Calculation (min) (ACUTE ONLY): 16 min ? ?Charges:  $Gait Training: 8-22 mins          ?          ? ?Sophia Long, PT  ?Office # [phone] ?Pager [phone] ? ? ? ?[name] ?09/20/2021, 8:20 AM ? ?

## 2021-09-20 NOTE — Evaluation (Signed)
Occupational Therapy Evaluation ?Patient Details ?Name: Sophia Long ?MRN: [id number] ?DOB: January 12, 1981 ?Today's Date: 09/20/2021 ? ? ?History of Present Illness Pt is a 41 y/o female s/p R TKA. PMH includes PE/DVT, R knee surgery, acoustic neuroma and Chron's disease.  ? ?Clinical Impression ?  ?Patient evaluated by Occupational Therapy with no further acute OT needs identified. All education has been completed and the patient has no further questions. See below for any follow-up Occupational Therapy or equipment needs. OT to sign off. Thank you for referral.  ?  ?   ? ?Recommendations for follow up therapy are one component of a multi-disciplinary discharge planning process, led by the attending physician.  Recommendations may be updated based on patient status, additional functional criteria and insurance authorization.  ? ?Follow Up Recommendations ? No OT follow up  ?  ?Assistance Recommended at Discharge None  ?Patient can return home with the following Assist for transportation ? ?  ?Functional Status Assessment ? Patient has had a recent decline in their functional status and demonstrates the ability to make significant improvements in function in a reasonable and predictable amount of time.  ?Equipment Recommendations ? BSC/3in1 (issued already in room per pt)  ?  ?Recommendations for Other Services   ? ? ?  ?Precautions / Restrictions Precautions ?Precautions: Knee ?Precaution Comments: Verbally reviewed knee precautions. ?Restrictions ?Weight Bearing Restrictions: Yes ?RLE Weight Bearing: Weight bearing as tolerated  ? ?  ? ?Mobility Bed Mobility ?Overal bed mobility: Modified Independent ?  ?  ?  ?  ?  ?  ?  ?  ? ?Transfers ?Overall transfer level: Needs assistance ?Equipment used: Rolling walker (2 wheels) ?Transfers: Sit to/from Stand ?Sit to Stand: Supervision ?  ?  ?  ?  ?  ?  ?  ? ?  ?Balance   ?  ?  ?  ?  ?  ?  ?  ?  ?  ?  ?  ?  ?  ?  ?  ?  ?  ?  ?   ? ?ADL either performed or assessed with clinical  judgement  ? ?ADL Overall ADL's : Needs assistance/impaired ?Eating/Feeding: Independent ?  ?Grooming: Independent ?  ?Upper Body Bathing: Independent ?  ?Lower Body Bathing: Min guard ?  ?Upper Body Dressing : Independent ?  ?Lower Body Dressing: Min guard ?Lower Body Dressing Details (indicate cue type and reason): able to foward flex to don socks and pants without AE needs ?Toilet Transfer: Min guard ?  ?  ?  ?  ?  ?  ?General ADL Comments: educated on ice machine and always something between skin and machine. 30 minutes on max at a time. pt has ice pack as well as a second method. Educated on spirometer as pt has a hx of PE  ? ? ? ?Vision Baseline Vision/History: 0 No visual deficits ?   ?   ?Perception   ?  ?Praxis   ?  ? ?Pertinent Vitals/Pain Pain Assessment ?Pain Assessment: No/denies pain  ? ? ? ?Hand Dominance Right ?  ?Extremity/Trunk Assessment Upper Extremity Assessment ?Upper Extremity Assessment: Overall WFL for tasks assessed ?  ?Lower Extremity Assessment ?Lower Extremity Assessment: Defer to PT evaluation ?  ?Cervical / Trunk Assessment ?Cervical / Trunk Assessment: Normal ?  ?Communication Communication ?Communication: No difficulties ?  ?Cognition Arousal/Alertness: Awake/alert ?Behavior During Therapy: [facility] for tasks assessed/performed ?Overall Cognitive Status: Within Functional Limits for tasks assessed ?  ?  ?  ?  ?  ?  ?  ?  ?  ?  ?  ?  ?  ?  ?  ?  ?  General Comments: reports being a little groggy due to medication. spouse present for all education ?  ?  ?General Comments  dressing dry and intact at this time. ? ?  ?Exercises   ?  ?Shoulder Instructions    ? ? ?Home Living Family/patient expects to be discharged to:: Private residence ?Living Arrangements: Spouse/significant other;Children ?Available Help at Discharge: Family ?Type of Home: House ?Home Access: Stairs to enter ?Entrance Stairs-Number of Steps: 1 ?Entrance Stairs-Rails: None ?Home Layout: One level ?  ?  ?Bathroom Shower/Tub:  Tub/shower unit ?  ?Bathroom Toilet: Standard ?  ?  ?Home Equipment: Conservation officer, nature (2 wheels);Cane - single point;BSC/3in1 ?  ?Additional Comments: spouse present and x2 teenagers at home to (A) . Pt has x3 dogs and x1 cat. the german shepard is a 4 mo puppy ?  ? ?  ?Prior Functioning/Environment Prior Level of Function : Independent/Modified Independent ?  ?  ?  ?  ?  ?  ?  ?  ?  ? ?  ?  ?OT Problem List: Decreased activity tolerance;Impaired balance (sitting and/or standing) ?  ?   ?OT Treatment/Interventions:    ?  ?OT Goals(Current goals can be found in the care plan section) Acute Rehab OT Goals ?Patient Stated Goal: to go home  ?OT Frequency:   ?  ? ?Co-evaluation   ?  ?  ?  ?  ? ?  ?AM-PAC OT "6 Clicks" Daily Activity     ?Outcome Measure Help from another person eating meals?: None ?Help from another person taking care of personal grooming?: None ?Help from another person toileting, which includes using toliet, bedpan, or urinal?: A Little ?Help from another person bathing (including washing, rinsing, drying)?: A Little ?Help from another person to put on and taking off regular upper body clothing?: None ?Help from another person to put on and taking off regular lower body clothing?: None ?6 Click Score: 22 ?  ?End of Session Equipment Utilized During Treatment: Rolling walker (2 wheels) ?Nurse Communication: Mobility status;Precautions ? ?Activity Tolerance: Patient tolerated treatment well ?Patient left: in bed;with call bell/phone within reach;Other (comment) (PT arriving for second session) ? ?OT Visit Diagnosis: Unsteadiness on feet (R26.81)  ?              ?Time: 5364-6803 ?OT Time Calculation (min): 23 min ?Charges:  OT General Charges ?$OT Visit: 1 Visit ?OT Evaluation ?$OT Eval Moderate Complexity: 1 Mod ? ? ?Brynn, OTR/L  ?Acute Rehabilitation Services ?Pager: [phone] ?Office: [phone] ?. ? ? ?Jeri Modena ?09/20/2021, 10:38 AM ?

## 2021-09-20 NOTE — Progress Notes (Signed)
Pt doing well. Pt given D/C instructions with verbal understanding. Rx's were sent to the pharmacy by MD. Pt's incision is clean and dry with no sign of infection. Pt's IV was removed prior to D/C. Pt's Home Health was arranged by MD office. Pt D/C'd home via wheelchair per MD order. Pt is stable @ D/C and has no other needs at this time. Holli Humbles, RN  ?

## 2021-09-20 NOTE — Discharge Summary (Signed)
Patient ID: ?Yania Bogie ?MRN: [id number] ?DOB/AGE: Feb 01, 1981 41 y.o. ? ?Admit date: 09/19/2021 ?Discharge date: 09/20/2021 ? ?Admission Diagnoses:  ?Principal Problem: ?  Primary osteoarthritis of right knee ?Active Problems: ?  Status post total right knee replacement ? ? ?Discharge Diagnoses:  ?Same ? ?Past Medical History:  ?Diagnosis Date  ? Acoustic neuroma [facility]) 2013  ? ADHD (attention deficit hyperactivity disorder)   ? Asthma   ? Crohn's disease ([place]) 2000  ? DVT (deep venous thrombosis) ([place]) 2012  ? History of hiatal hernia 2018  ? Pulmonary embolism ([place]) 2012  ? ? ?Surgeries: Procedure(s): ?RIGHT TOTAL KNEE ARTHROPLASTY on 09/19/2021 ?  ?Consultants:  ? ?Discharged Condition: Improved ? ?Hospital Course: Alissa Pharr is an 41 y.o. female who was admitted 09/19/2021 for operative treatment ofPrimary osteoarthritis of right knee. Patient has severe unremitting pain that affects sleep, daily activities, and work/hobbies. After pre-op clearance the patient was taken to the operating room on 09/19/2021 and underwent  Procedure(s): ?RIGHT TOTAL KNEE ARTHROPLASTY.   ? ?Patient was given perioperative antibiotics:  ?Anti-infectives (From admission, onward)  ? ? Start     Dose/Rate Route Frequency Ordered Stop  ? 09/19/21 1300  ceFAZolin (ANCEF) IVPB 2g/100 mL premix       ? 2 g ?200 mL/hr over 30 Minutes Intravenous Every 6 hours 09/19/21 1213 09/19/21 1858  ? 09/19/21 1010  vancomycin (VANCOCIN) powder  Status:  Discontinued       ?   As needed 09/19/21 1016 09/19/21 1056  ? 09/19/21 0745  clindamycin (CLEOCIN) IVPB 900 mg       ? 900 mg ?100 mL/hr over 30 Minutes Intravenous On call to O.R. 09/19/21 8309 09/19/21 0903  ? 09/19/21 0734  clindamycin (CLEOCIN) 900 MG/50ML IVPB       ?Note to Pharmacy: Blythe Stanford: cabinet override  ?    09/19/21 0734 09/19/21 0918  ? ?  ?  ? ?Patient was given sequential compression devices, early ambulation, and chemoprophylaxis to prevent DVT. ? ?Patient benefited maximally from  hospital stay and there were no complications.   ? ?Recent vital signs: Patient Vitals for the past 24 hrs: ? BP Temp Temp src Pulse Resp SpO2  ?09/20/21 0724 122/80 98.8 ?F (37.1 ?C) Oral (!) 58 18 99 %  ?09/20/21 0329 122/67 98.5 ?F (36.9 ?C) Oral (!) 50 20 99 %  ?09/19/21 2307 123/65 97.8 ?F (36.6 ?C) Oral (!) 51 20 98 %  ?09/19/21 1938 123/78 98.3 ?F (36.8 ?C) Oral (!) 51 18 100 %  ?09/19/21 1500 122/77 97.9 ?F (36.6 ?C) -- (!) 47 18 100 %  ?09/19/21 1218 123/89 -- -- (!) 49 18 100 %  ?09/19/21 1200 113/78 -- -- (!) 50 13 95 %  ?09/19/21 1150 110/74 97.6 ?F (36.4 ?C) -- (!) 53 13 95 %  ?09/19/21 1135 (!) 107/91 -- -- (!) 50 12 96 %  ?09/19/21 1120 107/66 -- -- (!) 48 13 93 %  ?09/19/21 1105 106/63 -- -- 72 (!) 22 96 %  ?09/19/21 1100 -- (!) 97 ?F (36.1 ?C) -- -- -- 95 %  ?09/19/21 0827 (!) 94/57 -- -- (!) 51 11 98 %  ?  ? ?Recent laboratory studies:  ?Recent Labs  ?  09/20/21 ?0541  ?WBC 15.0*  ?HGB 10.5*  ?HCT 32.8*  ?PLT 246  ?NA 141  ?K 4.6  ?CL 109  ?CO2 26  ?BUN 10  ?CREATININE 0.76  ?GLUCOSE 132*  ?CALCIUM 8.6*  ? ? ? ?Discharge  Medications:   ?Allergies as of 09/20/2021   ? ?   Reactions  ? Morphine Shortness Of Breath  ? Nitrofurantoin Hives, Shortness Of Breath  ? DIFFICULTY BREATHING,HIVES  ? Penicillins Shortness Of Breath  ? TROUBLE BREATHING  ? Azithromycin   ? Per [facility] [phone]  ? Cephalexin Hives  ? Red skin  ? Ciprofloxacin Hives  ? Red skin  ? Erythromycin Hives  ? Red skin  ? Ketorolac Hives  ? Red skin  ? Metronidazole Hives  ? Red skin  ? Ondansetron   ? Per [facility] [phone]  ? Other   ? BROKE CONTRACT - [facility]CS FROM [facility] PER NP GRANTZ - 02-03-13  ? ?  ? ?  ?Medication List  ?  ? ?TAKE these medications   ? ?alum & mag hydroxide-simeth 200-200-20 MG/5ML suspension ?Commonly known as: MAALOX/MYLANTA ?Take by mouth every 6 (six) hours as needed for indigestion or heartburn. ?  ?amphetamine-dextroamphetamine 30 MG tablet ?Commonly known as: ADDERALL ?Take 1 tablet by mouth daily. ?  ?calcium  carbonate 500 MG chewable tablet ?Commonly known as: TUMS - dosed in mg elemental calcium ?Chew 1 tablet by mouth daily. ?  ?ALKA-SELTZER ANTACID PO ?Take by mouth. ?  ?docusate sodium 100 MG capsule ?Commonly known as: Colace ?Take 1 capsule (100 mg total) by mouth daily as needed. ?  ?methocarbamol 500 MG tablet ?Commonly known as: Robaxin ?Take 1 tablet (500 mg total) by mouth 2 (two) times daily as needed. ?  ?omeprazole 20 MG capsule ?Commonly known as: PRILOSEC ?Take 1 capsule (20 mg total) by mouth daily. ?  ?oxyCODONE-acetaminophen 5-325 MG tablet ?Commonly known as: Percocet ?Take 1-2 tablets by mouth every 6 (six) hours as needed. ?  ?rivaroxaban 10 MG Tabs tablet ?Commonly known as: XARELTO ?Take 1 tablet (10 mg total) by mouth daily. To prevent blood clots ?  ?traZODone 100 MG tablet ?Commonly known as: DESYREL ?Take 1 tablet (100 mg total) by mouth at bedtime. ?What changed:  ?when to take this ?reasons to take this ?  ? ?  ? ?  ?  ? ? ?  ?Durable Medical Equipment  ?(From admission, onward)  ?  ? ? ?  ? ?  Start     Ordered  ? 09/19/21 1213  DME Walker rolling  Once       ?Question Answer Comment  ?Walker: With 5 Inch Wheels   ?Patient needs a walker to treat with the following condition Status post left partial knee replacement   ?  ? 09/19/21 1213  ? 09/19/21 1213  DME 3 n 1  Once       ? 09/19/21 1213  ? 09/19/21 1213  DME Bedside commode  Once       ?Question:  Patient needs a bedside commode to treat with the following condition  Answer:  Status post left partial knee replacement  ? 09/19/21 1213  ? ?  ?  ? ?  ? ? ?Diagnostic Studies: MR Knee Right w/o contrast ? ?Result Date: 08/30/2021 ?CLINICAL DATA:  Chronic right knee pain for 10 years. Evaluate degenerative joint disease. History of surgery twice for patellar realignment. EXAM: MRI OF THE RIGHT KNEE WITHOUT CONTRAST TECHNIQUE: Multiplanar, multisequence MR imaging of the knee was performed. No intravenous contrast was administered.  COMPARISON:  Right knee radiographs 08/25/2021 FINDINGS: MENISCI Medial meniscus: There is near absence of normal meniscal tissue within the root of the anterior horn of the medial meniscus (sagittal series 7, image 16).  There is intermediate proton medial meniscus density signal and mild degenerative change within the central 50% of the meniscal triangle of the root of the posterior horn of the medial meniscus (sagittal series 7, image 18). Otherwise there is no additional discrete tear extending through an articular surface of the medial meniscus. Lateral meniscus: There is mild intermediate proton density signal intrasubstance degeneration throughout the anterior horn of the lateral meniscus. There is mild truncation of the free edge of the body of the lateral meniscus. LIGAMENTS Cruciates: The ACL and PCL are intact. Collaterals: The medial collateral ligament is intact. The fibular collateral ligament, biceps femoris tendon, iliotibial band, and popliteus tendon are intact. CARTILAGE Patellofemoral: There is full-thickness cartilage loss throughout the majority of the medial patellar facet. High-grade partial to full-thickness cartilage loss within the inferior aspect of the lateral patellar facet cartilage. There is high-grade partial and multifocal full-thickness cartilage loss throughout the lateral greater than medial trochleae. Medial: There is diffuse high-grade partial and full-thickness cartilage loss throughout the mid to medial aspect of the weight-bearing medial femoral condyle and far medial aspect of the medial tibial plateau. Lateral: High-grade partial to full-thickness cartilage loss within the lateral aspect of the weight-bearing lateral femoral condyle with mild subchondral marrow edema and moderate peripheral degenerative osteophytes. Joint: Nojoint effusion. Normal Hoffa's fat pad. No plical thickening. Popliteal Fossa:  No Baker's cyst. Extensor Mechanism: The quadriceps tendon insertion is  intact. There is again prominence of the tibial tubercle as seen on prior radiographs. There are scattered metallic densities around the tibial tubercle consistent with prior patellar realignment surgery and tibial

## 2021-09-20 NOTE — Progress Notes (Signed)
Subjective: ?1 Day Post-Op Procedure(s) (LRB): ?RIGHT TOTAL KNEE ARTHROPLASTY (Right) ?Patient reports pain as mild.  Doing well without complaints ? ?Objective: ?Vital signs in last 24 hours: ?Temp:  [97 ?F (36.1 ?C)-98.8 ?F (37.1 ?C)] 98.8 ?F (37.1 ?C) (04/04 7096) ?Pulse Rate:  [47-72] 58 (04/04 0724) ?Resp:  [11-22] 18 (04/04 0724) ?BP: (94-123)/(57-91) 122/80 (04/04 0724) ?SpO2:  [93 %-100 %] 99 % (04/04 0724) ? ?Intake/Output from previous day: ?04/03 0701 - 04/04 0700 ?In: 4383 [P.O.:570; I.V.:856; IV Piggyback:350] ?Out: 1360 [Urine:1350; Blood:10] ?Intake/Output this shift: ?No intake/output data recorded. ? ?Recent Labs  ?  09/20/21 ?0541  ?HGB 10.5*  ? ?Recent Labs  ?  09/20/21 ?0541  ?WBC 15.0*  ?RBC 3.62*  ?HCT 32.8*  ?PLT 246  ? ?Recent Labs  ?  09/20/21 ?0541  ?NA 141  ?K 4.6  ?CL 109  ?CO2 26  ?BUN 10  ?CREATININE 0.76  ?GLUCOSE 132*  ?CALCIUM 8.6*  ? ?No results for input(s): LABPT, INR in the last 72 hours. ? ?Neurologically intact ?Neurovascular intact ?Sensation intact distally ?Intact pulses distally ?Dorsiflexion/Plantar flexion intact ?Incision: dressing C/D/I ?No cellulitis present ?Compartment soft ? ? ?Assessment/Plan: ?1 Day Post-Op Procedure(s) (LRB): ?RIGHT TOTAL KNEE ARTHROPLASTY (Right) ?Advance diet ?Up with therapy ?D/C IV fluids ?Discharge home with home health after second PT session ?WBAT RLE ?ABLA- mild and stable ? ?Anticipated LOS equal to or greater than 2 midnights due to ?- Age 43 and older with one or more of the following: ? - Obesity ? - Expected need for hospital services (PT, OT, Nursing) required for safe  discharge ? - Anticipated need for postoperative skilled nursing care or inpatient rehab ? - Active co-morbidities: DVT/VTE ?OR  ? ?- Unanticipated findings during/Post Surgery: None  ?- Patient is a high risk of re-admission due to: None ? ? ?Aundra Dubin ?09/20/2021, 8:03 AM ? ?

## 2021-09-20 NOTE — Progress Notes (Addendum)
Physical Therapy Treatment ?Patient Details ?Name: Sophia Long ?MRN: [id number] ?DOB: April 16, 1981 ?Today's Date: 09/20/2021 ? ? ?History of Present Illness Pt is a 41 y/o female s/p R TKA. PMH includes PE/DVT, R knee surgery, acoustic neuroma and Chron's disease. ? ?  ?PT Comments  ? ? Pt performed LE exercises supine and sitting. HEP handouts provided. Educated on ascend/descend 1 step with RW.   ?Recommendations for follow up therapy are one component of a multi-disciplinary discharge planning process, led by the attending physician.  Recommendations may be updated based on patient status, additional functional criteria and insurance authorization. ? ?Follow Up Recommendations ? Follow physician's recommendations for discharge plan and follow up therapies ?  ?  ?Assistance Recommended at Discharge Intermittent Supervision/Assistance  ?Patient can return home with the following Help with stairs or ramp for entrance;Assist for transportation;Assistance with cooking/housework ?  ?Equipment Recommendations ? None recommended by PT  ?  ?Recommendations for Other Services   ? ? ?  ?Precautions / Restrictions Precautions ?Precautions: Knee ?Precaution Comments: Verbally reviewed knee precautions. ?Restrictions ?Weight Bearing Restrictions: Yes ?RLE Weight Bearing: Weight bearing as tolerated  ?  ? ?Mobility ? Bed Mobility ?Overal bed mobility: Modified Independent ?  ?  ?  ?  ?  ?  ?General bed mobility comments: HOB elevated, +rail, using LLE to assist RLE back to bed ?  ? ?Transfers ?Overall transfer level: Needs assistance ?Equipment used: Rolling walker (2 wheels) ?Transfers: Sit to/from Stand ?Sit to Stand: Supervision ?  ?  ?  ?  ?  ?General transfer comment: supervision for safety ?  ? ?Ambulation/Gait ?Ambulation/Gait assistance: Min guard ?Gait Distance (Feet): 200 Feet ?Assistive device: Rolling walker (2 wheels) ?Gait Pattern/deviations: Step-through pattern, Decreased stride length ?Gait velocity: Decreased ?  ?   ?General Gait Details: cues for heel strike and toe off RLE ? ? ?Stairs ?Stairs:  (Educated verbally and through demonstration in room. Pt/spouse verbalize understanding and voice no concerns.) ?  ?  ?  ?  ? ? ?Wheelchair Mobility ?  ? ?Modified Rankin (Stroke Patients Only) ?  ? ? ?  ?Balance Overall balance assessment: Needs assistance ?Sitting-balance support: No upper extremity supported, Feet supported ?Sitting balance-Leahy Scale: Good ?  ?  ?Standing balance support: Bilateral upper extremity supported, During functional activity, No upper extremity supported ?Standing balance-Leahy Scale: Fair ?Standing balance comment: static stand without UE support ?  ?  ?  ?  ?  ?  ?  ?  ?  ?  ?  ?  ? ?  ?Cognition Arousal/Alertness: Awake/alert ?Behavior During Therapy: [facility] for tasks assessed/performed ?Overall Cognitive Status: Within Functional Limits for tasks assessed ?  ?  ?  ?  ?  ?  ?  ?  ?  ?  ?  ?  ?  ?  ?  ?  ?  ?  ?  ? ?  ?Exercises Total Joint Exercises ?Ankle Circles/Pumps: AROM, Both, 10 reps, Supine ?Quad Sets: AROM, Right, 5 reps, Supine ?Short Arc Quad: AROM, Right, 5 reps, Supine ?Heel Slides: AROM, Right, 5 reps, Supine ?Hip ABduction/ADduction: AROM, Right, 5 reps, Supine ?Straight Leg Raises: AROM, Right, 5 reps, Supine ?Goniometric ROM: 0-60 R knee ? ?  ?General Comments General comments (skin integrity, edema, etc.): dressing dry and intact at this time. ?  ?  ? ?Pertinent Vitals/Pain Pain Assessment ?Pain Assessment: Faces ?Pain Score: 7  ?Faces Pain Scale: Hurts little more ?Pain Location: R knee ?Pain Descriptors / Indicators: Tightness, Discomfort, Grimacing,  Guarding ?Pain Intervention(s): Monitored during session, Repositioned  ? ? ?Home Living Family/patient expects to be discharged to:: Private residence ?Living Arrangements: Spouse/significant other;Children ?Available Help at Discharge: Family ?Type of Home: House ?Home Access: Stairs to enter ?Entrance Stairs-Rails: None ?Entrance  Stairs-Number of Steps: 1 ?  ?Home Layout: One level ?Home Equipment: Conservation officer, nature (2 wheels);Cane - single point;BSC/3in1 ?Additional Comments: spouse present and x2 teenagers at home to (A) . Pt has x3 dogs and x1 cat. the german shepard is a 4 mo puppy  ?  ?Prior Function    ?  ?  ?   ? ?PT Goals (current goals can now be found in the care plan section) Acute Rehab PT Goals ?Patient Stated Goal: home ?Progress towards PT goals: Progressing toward goals ? ?  ?Frequency ? ? ? 7X/week ? ? ? ?  ?PT Plan Current plan remains appropriate  ? ? ?Co-evaluation   ?  ?  ?  ?  ? ?  ?AM-PAC PT "6 Clicks" Mobility   ?Outcome Measure ? Help needed turning from your back to your side while in a flat bed without using bedrails?: None ?Help needed moving from lying on your back to sitting on the side of a flat bed without using bedrails?: A Little ?Help needed moving to and from a bed to a chair (including a wheelchair)?: A Little ?Help needed standing up from a chair using your arms (e.g., wheelchair or bedside chair)?: A Little ?Help needed to walk in hospital room?: A Little ?Help needed climbing 3-5 steps with a railing? : A Little ?6 Click Score: 19 ? ?  ?End of Session Equipment Utilized During Treatment: Gait belt ?Activity Tolerance: Patient tolerated treatment well ?Patient left: in bed;with call bell/phone within reach;with family/visitor present ?Nurse Communication: Mobility status ?PT Visit Diagnosis: Other abnormalities of gait and mobility (R26.89);Pain ?Pain - Right/Left: Right ?Pain - part of body: Knee ?  ? ? ?Time: 5329-9242 ?PT Time Calculation (min) (ACUTE ONLY): 14 min ? ?Charges:  $Gait Training: 8-22 mins ?$Therapeutic Exercise: 8-22 mins          ?          ? ?[name], PT  ?Office # [phone] ?Pager [phone] ? ? ? ?Sophia Long ?09/20/2021, 11:03 AM ? ?

## 2021-09-21 ENCOUNTER — Telehealth: Payer: Self-pay

## 2021-09-21 ENCOUNTER — Other Ambulatory Visit: Payer: Self-pay | Admitting: Physician Assistant

## 2021-09-21 ENCOUNTER — Telehealth: Payer: Self-pay | Admitting: Orthopaedic Surgery

## 2021-09-21 ENCOUNTER — Encounter (HOSPITAL_COMMUNITY): Payer: Self-pay | Admitting: Orthopaedic Surgery

## 2021-09-21 ENCOUNTER — Other Ambulatory Visit: Payer: Self-pay | Admitting: *Deleted

## 2021-09-21 DIAGNOSIS — F900 Attention-deficit hyperactivity disorder, predominantly inattentive type: Secondary | ICD-10-CM

## 2021-09-21 MED ORDER — HYDROMORPHONE HCL 2 MG PO TABS: 2.0000 mg | ORAL_TABLET | Freq: Three times a day (TID) | ORAL | 0 refills | Status: DC | PRN

## 2021-09-21 NOTE — Telephone Encounter (Signed)
I called patient and advised. 

## 2021-09-21 NOTE — Telephone Encounter (Signed)
Transition Care Management Follow-up Telephone Call ?Date of discharge and from where: 09/20/2021, [facility]  ?How have you been since you were released from the hospital? She said she is really hurting. Has been taking percocet - 2 tablets every 6 hours but it is not relieving the pain. She has contacted Dr Phoebe Sharps office and is waiting for a call back.  ?Any questions or concerns? Yes - regarding pain, noted above.  ?She also has a question about her walker and will address that with the PT who comes to see her this afternoon.  ? ?Items Reviewed: ?Did the pt receive and understand the discharge instructions provided? Yes  ?Medications obtained and verified? Yes  - she said she has all of her medications but needs refills for her routine meds. Informed her that Dr Redmond Pulling will send refills for 30 days to her pharmacy, she does not need an appointment at [facility] tomorrow for med refills.  Patient requesting they be sent to [facility] [place].  ?Other? No  ?Any new allergies since your discharge? No  ?Dietary orders reviewed? No ?Do you have support at home? Yes - her husband and sons ? ?Home Care and Equipment/Supplies: ?Were home health services ordered? yes ?If so, what is the name of the agency? [facility]   ?Has the agency set up a time to come to the patient's home? Yes - PT is coming this afternoon  ?Were any new equipment or medical supplies ordered?  Yes: CPM, ice machine ?What is the name of the medical supply agency? This DME was ordered by orthopedic surgery.  ?Were you able to get the supplies/equipment? yes ?Do you have any questions related to the use of the equipment or supplies? No ? ?Functional Questionnaire: (I = Independent and D = Dependent) ?ADLs: ambulating with RW, WBT  RLE. Her husband has been assisting with ADLs as needed ? ?Follow up appointments reviewed: ? ?PCP Hospital f/u appt confirmed?  She will call to schedule an appointment    ?[facility] f/u appt confirmed? Yes   Scheduled to see Dr Erlinda Hong - 10/04/2021,  ?Are transportation arrangements needed? No  ?If their condition worsens, is the pt aware to call PCP or go to the Emergency Dept.? Yes ?Was the patient provided with contact information for the PCP's office or ED? Yes ?Was to pt encouraged to call back with questions or concerns? Yes ? ?

## 2021-09-21 NOTE — Telephone Encounter (Signed)
Please advise 

## 2021-09-21 NOTE — Telephone Encounter (Signed)
Pt states she had total knee replacement Monday and here medication is not touching her pain. Please call pt. She also states she is unable to sleep. Pt phone number is [phone]. ?

## 2021-09-21 NOTE — Telephone Encounter (Signed)
I sent in dilaudid for breakthrough pain.  Please make sure she spaces it out between percocet

## 2021-09-22 ENCOUNTER — Other Ambulatory Visit: Payer: Self-pay | Admitting: Family Medicine

## 2021-09-22 ENCOUNTER — Ambulatory Visit: Admitting: Family

## 2021-09-22 ENCOUNTER — Ambulatory Visit: Admitting: Family Medicine

## 2021-09-22 DIAGNOSIS — K219 Gastro-esophageal reflux disease without esophagitis: Secondary | ICD-10-CM

## 2021-09-22 DIAGNOSIS — F900 Attention-deficit hyperactivity disorder, predominantly inattentive type: Secondary | ICD-10-CM

## 2021-09-22 MED ORDER — AMPHETAMINE-DEXTROAMPHETAMINE 30 MG PO TABS: 30.0000 mg | ORAL_TABLET | Freq: Every day | ORAL | 0 refills | Status: DC

## 2021-09-23 ENCOUNTER — Encounter: Payer: Self-pay | Admitting: Orthopaedic Surgery

## 2021-09-26 ENCOUNTER — Telehealth: Payer: Self-pay | Admitting: Physician Assistant

## 2021-09-26 ENCOUNTER — Other Ambulatory Visit: Payer: Self-pay | Admitting: Physician Assistant

## 2021-09-26 MED ORDER — OXYCODONE-ACETAMINOPHEN 5-325 MG PO TABS: 1.0000 | ORAL_TABLET | Freq: Four times a day (QID) | ORAL | 0 refills | Status: DC | PRN

## 2021-09-26 NOTE — Telephone Encounter (Signed)
Just sent to correct p harm.

## 2021-09-26 NOTE — Telephone Encounter (Signed)
Pt states this message is urgent. Pt states she went to pick up her oxycodone and they pharmacy informed her that they are out and no pharmacy hs it in a 10 mile radius. Pt states pharmacy only has oxycodone 71m or can she get another pain medication if possible. Please call pt about this matter at [phone]. ?

## 2021-09-26 NOTE — Telephone Encounter (Signed)
Please send the medication to [facility]  on [address]  ? ?Please call the pt when called in  ?

## 2021-09-26 NOTE — Telephone Encounter (Signed)
Patient called back. Pharmacy is out of Oxycodone. New message sent to [facility] to advise. ?

## 2021-09-26 NOTE — Telephone Encounter (Signed)
Ok to do

## 2021-09-26 NOTE — Telephone Encounter (Signed)
Called to Advise pt  ?

## 2021-09-26 NOTE — Telephone Encounter (Signed)
sent 

## 2021-09-26 NOTE — Telephone Encounter (Signed)
Patient called needing Rx refilled Oxycodone. Patient said she has 2 tabs left. Patient had (PT) today. ? ?The number to contact patient is [phone] ?

## 2021-09-26 NOTE — Telephone Encounter (Signed)
Please advise 

## 2021-09-28 ENCOUNTER — Telehealth: Payer: Self-pay

## 2021-09-28 NOTE — Telephone Encounter (Signed)
Sophia Long with [facility] called stating that she is having increased pain in her right hip and back in addition to her right knee. No increased swelling or redness with her right knee. CB#  for Sophia Long [phone].  Cb# for patient is [phone].  Please advise.  Thank you. ?

## 2021-09-29 ENCOUNTER — Telehealth: Payer: Self-pay | Admitting: Physician Assistant

## 2021-09-29 ENCOUNTER — Other Ambulatory Visit: Payer: Self-pay | Admitting: Physician Assistant

## 2021-09-29 ENCOUNTER — Telehealth: Payer: Self-pay

## 2021-09-29 MED ORDER — METHOCARBAMOL 750 MG PO TABS: 750.0000 mg | ORAL_TABLET | Freq: Three times a day (TID) | ORAL | 1 refills | Status: DC | PRN

## 2021-09-29 MED ORDER — OXYCODONE-ACETAMINOPHEN 5-325 MG PO TABS: 1.0000 | ORAL_TABLET | ORAL | 0 refills | Status: DC | PRN

## 2021-09-29 NOTE — Telephone Encounter (Signed)
Spoke to patient

## 2021-09-29 NOTE — Telephone Encounter (Signed)
Patient called advised her Rx is not show received at the pharmacy. Patient said she uses the [facility] at [address]. Patient said her pain and muscle relaxer was suppose to a higher dosage when sent to the pharmacy. The number to contact patient is [phone]  ?

## 2021-09-29 NOTE — Telephone Encounter (Signed)
Patient left voicemail on the triage phone stating she was having pain into her back and hip and it was very painful and pain medicine wasn't helping, she is s/p TKA ?

## 2021-09-29 NOTE — Telephone Encounter (Signed)
Ok, thanks.

## 2021-09-29 NOTE — Telephone Encounter (Signed)
Sent in to correct pharmacy.  Please tell her that i'm sorry

## 2021-10-03 ENCOUNTER — Encounter: Payer: Self-pay | Admitting: Orthopaedic Surgery

## 2021-10-04 ENCOUNTER — Other Ambulatory Visit: Payer: Self-pay | Admitting: Physician Assistant

## 2021-10-04 ENCOUNTER — Ambulatory Visit (INDEPENDENT_AMBULATORY_CARE_PROVIDER_SITE_OTHER): Admitting: Physician Assistant

## 2021-10-04 ENCOUNTER — Encounter: Payer: Self-pay | Admitting: Orthopaedic Surgery

## 2021-10-04 DIAGNOSIS — Z96651 Presence of right artificial knee joint: Secondary | ICD-10-CM

## 2021-10-04 MED ORDER — PREDNISONE 10 MG (21) PO TBPK: ORAL_TABLET | ORAL | 0 refills | Status: DC

## 2021-10-04 MED ORDER — OXYCODONE-ACETAMINOPHEN 5-325 MG PO TABS: 1.0000 | ORAL_TABLET | ORAL | 0 refills | Status: DC | PRN

## 2021-10-04 NOTE — Progress Notes (Signed)
? ?Post-Op Visit Note ?  ?Patient: Sophia Long           ?Date of Birth: 05/31/1981           ?MRN: [id number] ?Visit Date: 10/04/2021 ?PCP: Dorna Mai, MD ? ? ?Assessment & Plan: ? ?Chief Complaint:  ?Chief Complaint  ?Patient presents with  ? Right Knee - Pain  ? ?Visit Diagnoses:  ?1. Hx of total knee replacement, right   ? ? ?Plan: Patient is a pleasant 41 year old female who comes in today 2 weeks status post right total knee placement replacement 09/19/2021.  She has been doing fairly well in regards to the knee, but has had increased pain to the right lower back with radiation down the back of the leg.  This feels like a lightening bolt sensations with burning in her foot.  She has been alternating Percocet and Dilaudid without significant relief.  She has been taking Xarelto for DVT prophylaxis.  She has been getting home health physical therapy and is ambulating with a walker.  Examination of the right knee reveals a well-healing surgical incision with nylon sutures in place.  No evidence of infection or cellulitis.  Calf is soft nontender.  She does have moderate tenderness to the right lower paraspinous musculature with a positive straight leg raise.  Today, sutures were removed and Steri-Strips applied.  We have submitted a hardcopy prescription to the [facility] for outpatient physical therapy.  She will continue her Xarelto for another 2 weeks.  I have Called in steroids to help with her low back pain and radiculopathy and have instructed her to call us early next week if her symptoms are still just as bad.  Otherwise, follow-up with [place] in 4 weeks time for repeat evaluation and 2 view x-rays of the right knee.  Call with concerns or questions. ? ?Follow-Up Instructions: Return in about 4 weeks (around 11/01/2021).  ? ?Orders:  ?No orders of the defined types were placed in this encounter. ? ?Meds ordered this encounter  ?Medications  ? predniSONE (STERAPRED UNI-PAK 21 TAB) 10 MG (21) TBPK tablet  ?  Sig: Take  as directed  ?  Dispense:  21 tablet  ?  Refill:  0  ? ? ?Imaging: ?No new imaging ? ?PMFS History: ?Patient Active Problem List  ? Diagnosis Date Noted  ? 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 Primary osteoarthritis of right knee 08/25/2021  ? Primary osteoarthritis of left knee 08/25/2021  ? 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 Asthma 08/15/2021  ? Hearing loss of left ear 08/15/2021  ? Encounter to establish care 08/15/2021  ? History of COVID-19 08/15/2021  ? Acute cough 08/15/2021  ? ?Past Medical History:  ?Diagnosis Date  ? Acoustic neuroma [facility]) 2013  ? ADHD (attention deficit hyperactivity disorder)   ? Asthma   ? Crohn's disease ([place]) 2000  ? DVT (deep venous thrombosis) ([place]) 2012  ? History of hiatal hernia 2018  ? Pulmonary embolism ([place]) 2012  ?  ?Family History  ?Problem Relation Age of Onset  ? Hypertension Mother   ? Diabetes Father   ? Colon cancer Maternal Uncle   ? Stomach cancer Maternal Uncle   ?  ?Past Surgical History:  ?Procedure Laterality Date  ? ABDOMINAL HYSTERECTOMY  2009  ? partial  ? CESAREAN SECTION  2002  ? x 4 (2002, 2004, 2005, 2008)  ? CHOLECYSTECTOMY  2000  ? GASTRIC  BYPASS  2019  ? HARDWARE REMOVAL Left 2015  ? knee  ? KNEE SURGERY Right 2012  ? KNEE SURGERY Left 2013  ? [place] RESECTION  2018  ? TENDON RECONSTRUCTION Right 2015  ? TOTAL KNEE ARTHROPLASTY Right 09/19/2021  ? Procedure: RIGHT TOTAL KNEE ARTHROPLASTY;  Surgeon: Leandrew Koyanagi, MD;  Location: [place];  Service: Orthopedics;  Laterality: Right;  ? TUBAL LIGATION  2005  ? ?Social History  ? ?Occupational History  ? Not on file  ?Tobacco Use  ? Smoking status: Every Day  ?  Packs/day: 0.20  ?  Types: Cigarettes  ? Smokeless tobacco: Never  ?Vaping Use  ? Vaping Use: Never used  ?Substance and Sexual Activity  ? Alcohol use: Not Currently  ? Drug use: Never  ? Sexual activity: Yes  ? ? ? ?

## 2021-10-04 NOTE — Telephone Encounter (Signed)
Refilled the percocet

## 2021-10-07 ENCOUNTER — Other Ambulatory Visit: Payer: Self-pay | Admitting: Physician Assistant

## 2021-10-07 ENCOUNTER — Telehealth: Payer: Self-pay | Admitting: Orthopaedic Surgery

## 2021-10-07 ENCOUNTER — Other Ambulatory Visit: Payer: Self-pay

## 2021-10-07 DIAGNOSIS — Z96651 Presence of right artificial knee joint: Secondary | ICD-10-CM

## 2021-10-07 MED ORDER — METHOCARBAMOL 750 MG PO TABS: 750.0000 mg | ORAL_TABLET | Freq: Three times a day (TID) | ORAL | 1 refills | Status: DC | PRN

## 2021-10-07 MED ORDER — OXYCODONE-ACETAMINOPHEN 5-325 MG PO TABS: 1.0000 | ORAL_TABLET | ORAL | 0 refills | Status: DC | PRN

## 2021-10-07 NOTE — Telephone Encounter (Signed)
Sent mychart msg.

## 2021-10-07 NOTE — Telephone Encounter (Signed)
Sent in

## 2021-10-07 NOTE — Telephone Encounter (Signed)
Order made ? ?

## 2021-10-07 NOTE — Telephone Encounter (Signed)
Patient called. She would like a referral for outpatient PT to come here. Her call back number is [phone] ?

## 2021-10-07 NOTE — Telephone Encounter (Signed)
Patient called needing Rx refilled Methocarbamol and Percocet. The number to contact patient is [phone] ?

## 2021-10-11 ENCOUNTER — Ambulatory Visit: Admitting: Physical Therapy

## 2021-10-11 ENCOUNTER — Other Ambulatory Visit: Payer: Self-pay | Admitting: Physician Assistant

## 2021-10-11 ENCOUNTER — Encounter: Payer: Self-pay | Admitting: Physical Therapy

## 2021-10-11 ENCOUNTER — Ambulatory Visit (INDEPENDENT_AMBULATORY_CARE_PROVIDER_SITE_OTHER): Admitting: Physical Therapy

## 2021-10-11 ENCOUNTER — Telehealth: Payer: Self-pay | Admitting: Orthopaedic Surgery

## 2021-10-11 DIAGNOSIS — M25661 Stiffness of right knee, not elsewhere classified: Secondary | ICD-10-CM

## 2021-10-11 DIAGNOSIS — R2689 Other abnormalities of gait and mobility: Secondary | ICD-10-CM

## 2021-10-11 DIAGNOSIS — R600 Localized edema: Secondary | ICD-10-CM

## 2021-10-11 DIAGNOSIS — M6281 Muscle weakness (generalized): Secondary | ICD-10-CM

## 2021-10-11 DIAGNOSIS — M25561 Pain in right knee: Secondary | ICD-10-CM | POA: Diagnosis not present

## 2021-10-11 MED ORDER — OXYCODONE-ACETAMINOPHEN 5-325 MG PO TABS: 1.0000 | ORAL_TABLET | Freq: Four times a day (QID) | ORAL | 0 refills | Status: DC | PRN

## 2021-10-11 NOTE — Therapy (Signed)
?OUTPATIENT PHYSICAL THERAPY LOWER EXTREMITY EVALUATION ? ? ?Patient Name: Sophia Long ?MRN: [id number] ?DOB:1980-07-29, 41 y.o., female ?Today's Date: 10/11/2021 ? ? PT End of Session - 10/11/21 1252   ? ? Visit Number 1   ? Number of Visits 15   ? Date for PT Re-Evaluation 01/03/22   ? Authorization Type [employer]   ? PT Start Time 1145   ? PT Stop Time 1230   ? PT Time Calculation (min) 45 min   ? Activity Tolerance Patient tolerated treatment well   ? Behavior During Therapy [facility] for tasks assessed/performed   ? ?  ?  ? ?  ? ? ?Past Medical History:  ?Diagnosis Date  ? Acoustic neuroma [facility]) 2013  ? ADHD (attention deficit hyperactivity disorder)   ? Asthma   ? Crohn's disease ([place]) 2000  ? DVT (deep venous thrombosis) ([place]) 2012  ? History of hiatal hernia 2018  ? Pulmonary embolism ([place]) 2012  ? ?Past Surgical History:  ?Procedure Laterality Date  ? ABDOMINAL HYSTERECTOMY  2009  ? partial  ? CESAREAN SECTION  2002  ? x 4 (2002, 2004, 2005, 2008)  ? CHOLECYSTECTOMY  2000  ? GASTRIC BYPASS  2019  ? HARDWARE REMOVAL Left 2015  ? knee  ? KNEE SURGERY Right 2012  ? KNEE SURGERY Left 2013  ? [place] RESECTION  2018  ? TENDON RECONSTRUCTION Right 2015  ? TOTAL KNEE ARTHROPLASTY Right 09/19/2021  ? Procedure: RIGHT TOTAL KNEE ARTHROPLASTY;  Surgeon: Leandrew Koyanagi, MD;  Location: [place];  Service: Orthopedics;  Laterality: Right;  ? TUBAL LIGATION  2005  ? ?Patient Active Problem List  ? Diagnosis Date Noted  ? 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 Primary osteoarthritis of right knee 08/25/2021  ? Primary osteoarthritis of left knee 08/25/2021  ? 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 Asthma 08/15/2021  ? Hearing loss of left ear 08/15/2021  ? Encounter to establish care 08/15/2021  ? History of COVID-19 08/15/2021  ? Acute cough 08/15/2021  ? ? ?PCP: Dorna Mai, MD ? ?REFERRING PROVIDER: Leandrew Koyanagi, MD ? ?REFERRING  DIAG: W09.811 (ICD-10-CM) - Hx of total knee replacement, right ? ?THERAPY DIAG:  ?Acute pain of right knee ? ?Stiffness of right knee, not elsewhere classified ? ?Muscle weakness (generalized) ? ?Other abnormalities of gait and mobility ? ?Localized edema ? ?ONSET DATE: 09/19/21 ? ?SUBJECTIVE:  ? ?SUBJECTIVE STATEMENT: ?She had Rt TKA. She had HHPT, she is limited by sciatica on her Rt side. She is having difficulty bending her knee and lifting her leg ? ?PERTINENT HISTORY: ?Rt TKA 09/19/21, DVT and PE 2012, 2 other previous knee surgeries patella realighmenta nd tendon reconstruction ? ?PAIN:  ?Are you having pain? Yes: NPRS scale: 4/10 ?Pain location: Rt anterior knee ?Pain description: dull and achy but sharp if flexed too much ?Aggravating factors: leaving knee in same position too long ?Relieving factors: moving, ice, meds ? ?PRECAUTIONS: None ? ?WEIGHT BEARING RESTRICTIONS No ? ?FALLS:  ?Has patient fallen in last 6 months? Yes she slipped and hit left knee before she had Rt TKA. ? ?LIVING ENVIRONMENT: ?Lives with: lives with their family ?Lives in: House/apartment ?Stairs: one step to enter ? ? 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 ?PLOF: Independent ? ?PATIENT GOALS walk without assistance, get back to driving and work ? ? ?OBJECTIVE:  ? ?DIAGNOSTIC FINDINGS:  ?XR IMPRESSION: ?Expected appearance after right knee arthroplasty. ? ?  PATIENT SURVEYS:  ?FOTO 35% functional ? ?COGNITION: ? Overall cognitive status: Within functional limits for tasks assessed   ?  ?SENSATION: ?Light touch intackt ? ?MUSCLE LENGTH: ?Tight H.S, and quads on Rt ? ?PALPATION: ?TTP and pain anterior knee ? ?LE ROM: ? ?AROM/PROM Right ?10/11/2021   ?Hip flexion    ?Hip extension    ?Hip abduction    ?Hip adduction    ?Hip internal rotation    ?Hip external rotation    ?Knee flexion 75/80   ?Knee extension 1/0   ?Ankle dorsiflexion    ?Ankle plantarflexion    ?Ankle inversion    ?Ankle eversion    ? (Blank rows = not tested) ? ?LE MMT: ? ?MMT in  sitting Right ?10/11/2021   ?Hip flexion 3   ?Hip extension    ?Hip abduction 3   ?Hip adduction    ?Hip internal rotation    ?Hip external rotation    ?Knee flexion 3   ?Knee extension 2   ?Ankle dorsiflexion    ?Ankle plantarflexion    ?Ankle inversion    ?Ankle eversion    ? (Blank rows = not tested) ? ?FUNCTIONAL TESTS:  ? ? ?GAIT: ?Distance walked: 75 ?Assistive device utilized: Environmental consultant - 2 wheeled ?Level of assistance: supervision ?Comments: RW too short but is at its tallest level it can be adjusted, she has decreased hip/knee flexion and weight shift to Rt ? ? ? ?TODAY'S TREATMENT: ?10 reps of HEP listed below ?Knee flexion LLLD stretch over bolster and yellow ball X 2 min ?Nu step X 5 min L5 seat 8 ?Manual therapy for Rt knee PROM and flexion mobs ? ? ?PATIENT EDUCATION:  ?Education details: HEP,POC, ?Person educated: Patient and Spouse ?Education method: Explanation, Demonstration, Verbal cues, and Handouts ?Education comprehension: verbalized understanding, returned demonstration, and needs further education ? ? ?HOME EXERCISE PROGRAM: ?Access Code: [email or web address] ?URL: [email or web address] ?Date: 10/11/2021 ?Prepared by: Elsie Ra ? ?Exercises ?- Supine Heel Slide with Strap  - 2 x daily - 6 x weekly - 1-2 sets - 10 reps - 5 hold ?- Supine Quadricep Sets  - 2 x daily - 6 x weekly - 1-2 sets - 10 reps - 5 sec hold ?- Seated Long Arc Quad  - 2 x daily - 6 x weekly - 2-3 sets - 10 reps ?- Seated Knee Flexion Stretch  - 2 x daily - 6 x weekly - 1-2 sets - 10 reps - 5 sec hold ?- Seated Hamstring Stretch  - 2 x daily - 6 x weekly - 1 sets - 2-3 reps - 30 hold ? ?  ?ASSESSMENT: ? ?CLINICAL IMPRESSION: Patient presents with Rt knee TKA on 09/19/21. She has had 2 other knee surgeries in the past so may progress more slowly with her strength and flexion ROM. Her extension ROM is doing excellent. Patient will benefit from skilled PT to address below impairments, limitations and improve overall  function. ? ?OBJECTIVE IMPAIRMENTS: decreased activity tolerance, difficulty walking, decreased balance, decreased endurance, decreased mobility, decreased ROM, decreased strength, impaired flexibility, impaired sLE use, postural dysfunction, and pain. ? ?ACTIVITY LIMITATIONS: bending, lifting, carry, locomotion, cleaning, community activity, driving, and or occupation ? ?PERSONAL FACTORS: S/P RIGHT TKA-09/19/2021, DVT and PE 2012, 2 other previous knee surgeries patella realignment and tendon reconstruction are also affecting patient's functional outcome. ? ?REHAB POTENTIAL: good ? ?CLINICAL DECISION MAKING: Stable/uncomplicated ? ?EVALUATION COMPLEXITY: Low ? ? ? ?GOALS: ?Short term PT Goals Target date: 11/08/2021 ?Pt will be I and  compliant with HEP. ?Baseline:  ?Goal status: New ?Pt will decrease pain by 25% overall ?Baseline: ?Goal status: New ? ?Long term PT goals Target date: 01/03/2022 ?Pt will improve Rt knee AROM to 0-100 degrees to improve functional mobility ?Baseline: ?Goal status: New ?Pt will improve  hip/knee strength to at least 5-/5 MMT to improve functional strength ?Baseline: ?Goal status: New ?Pt will improve FOTO to at least 59% functional to show improved function ?Baseline: ?Goal status: New ?Pt will reduce pain by overall 50% overall with usual activity ?Baseline: ?Goal status: Ne ?Pt will be able to ambulate community distances at least 1000 ft WNL gait pattern without complaints or assistance ?Baseline: ?Goal status: New ? ?PLAN: ?PT FREQUENCY: 2-3 times per week  ? ?PT DURATION: 12 weeks ? ?PLANNED INTERVENTIONS (unless contraindicated): aquatic PT, Canalith repositioning, cryotherapy, Electrical stimulation, Iontophoresis with 4 mg/ml dexamethasome, Moist heat, traction, Ultrasound, gait training, Therapeutic exercise, balance training, neuromuscular re-education, patient/family education, prosthetic training, manual techniques, passive ROM, dry needling, taping, vasopnuematic device,  vestibular, spinal manipulations, joint manipulations ? ?PLAN FOR NEXT SESSION: knee flexion emphasis and quad activation ? ? ?Debbe Odea, PT,DPT ?10/11/2021, 12:53 PM ? ?

## 2021-10-11 NOTE — Telephone Encounter (Signed)
Please send [facility]ercocet to [facility] on [address]  ?

## 2021-10-11 NOTE — Telephone Encounter (Signed)
sent 

## 2021-10-12 NOTE — Telephone Encounter (Signed)
Sent mychart message

## 2021-10-13 ENCOUNTER — Encounter: Payer: Self-pay | Admitting: Rehabilitative and Restorative Service Providers"

## 2021-10-13 ENCOUNTER — Ambulatory Visit (INDEPENDENT_AMBULATORY_CARE_PROVIDER_SITE_OTHER): Admitting: Rehabilitative and Restorative Service Providers"

## 2021-10-13 ENCOUNTER — Other Ambulatory Visit: Payer: Self-pay

## 2021-10-13 DIAGNOSIS — R600 Localized edema: Secondary | ICD-10-CM

## 2021-10-13 DIAGNOSIS — M25661 Stiffness of right knee, not elsewhere classified: Secondary | ICD-10-CM

## 2021-10-13 DIAGNOSIS — M6281 Muscle weakness (generalized): Secondary | ICD-10-CM | POA: Diagnosis not present

## 2021-10-13 DIAGNOSIS — M25561 Pain in right knee: Secondary | ICD-10-CM

## 2021-10-13 DIAGNOSIS — R2689 Other abnormalities of gait and mobility: Secondary | ICD-10-CM | POA: Diagnosis not present

## 2021-10-13 NOTE — Therapy (Signed)
?OUTPATIENT PHYSICAL THERAPY TREATMENT NOTE ? ? ?Patient Name: Sophia Long ?MRN: [id number] ?DOB:1980/11/12, 41 y.o., female ?Today's Date: 10/13/2021 ? ? ?END OF SESSION:  ? PT End of Session - 10/13/21 1112   ? ? Visit Number 2   ? Number of Visits 15   ? Date for PT Re-Evaluation 01/03/22   ? Authorization Type [employer]   ? PT Start Time 1107   ? PT Stop Time 1156   ? PT Time Calculation (min) 49 min   ? Activity Tolerance Patient tolerated treatment well   ? Behavior During Therapy [facility] for tasks assessed/performed   ? ?  ?  ? ?  ? ? ?Past Medical History:  ?Diagnosis Date  ? 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 ADHD (attention deficit hyperactivity disorder)   ? Asthma   ? Crohn's disease ([place]) 2000  ? DVT (deep venous thrombosis) ([place]) 2012  ? History of hiatal hernia 2018  ? Pulmonary embolism ([place]) 2012  ? ?Past Surgical History:  ?Procedure Laterality Date  ? ABDOMINAL HYSTERECTOMY  2009  ? partial  ? CESAREAN SECTION  2002  ? x 4 (2002, 2004, 2005, 2008)  ? CHOLECYSTECTOMY  2000  ? GASTRIC BYPASS  2019  ? HARDWARE REMOVAL Left 2015  ? knee  ? KNEE SURGERY Right 2012  ? KNEE SURGERY Left 2013  ? [place] RESECTION  2018  ? TENDON RECONSTRUCTION Right 2015  ? TOTAL KNEE ARTHROPLASTY Right 09/19/2021  ? Procedure: RIGHT TOTAL KNEE ARTHROPLASTY;  Surgeon: Leandrew Koyanagi, MD;  Location: [place];  Service: Orthopedics;  Laterality: Right;  ? TUBAL LIGATION  2005  ? ?Patient Active Problem List  ? Diagnosis Date Noted  ? 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 Primary osteoarthritis of right knee 08/25/2021  ? Primary osteoarthritis of left knee 08/25/2021  ? 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 Asthma 08/15/2021  ? Hearing loss of left ear 08/15/2021  ? Encounter to establish care 08/15/2021  ? History of COVID-19 08/15/2021  ? Acute cough 08/15/2021  ? ? ?REFERRING PROVIDER: Leandrew Koyanagi, MD ?  ?REFERRING DIAG: J62.836  (ICD-10-CM) - Hx of total knee replacement, right ? ?ONSET DATE: 09/19/21 ? ?THERAPY DIAG:  ?Acute pain of right knee ? ?Stiffness of right knee, not elsewhere classified ? ?Muscle weakness (generalized) ? ?Other abnormalities of gait and mobility ? ?Localized edema ? ?PERTINENT HISTORY: Rt TKA 09/19/21, DVT and PE 2012, 2 other previous knee surgeries patella realighmenta nd tendon reconstruction ? ?PRECAUTIONS: None ? ?SUBJECTIVE: Pt indicated not doing well today, 5/10 pain indicated.  ? ?PAIN:  ?Are you having pain? Yes:  ?NPRS scale: 5/10 ?Pain location: Rt anterior knee ?Pain description: dull and achy but sharp if flexed too much ?Aggravating factors: static positioning, end range complaints.  ?Relieving factors: moving, ice, meds ? ? ?OBJECTIVE:  ?  ? ?PATIENT SURVEYS:  ?10/11/2021 ?FOTO 35% functional ?  ?COGNITION: ?          10/11/2021 ?Overall cognitive status: Within functional limits for tasks assessed               ?           ?SENSATION: ?10/11/2021 ?Light touch intackt ?  ?MUSCLE LENGTH: ?10/11/2021 ?Tight H.S, and quads on Rt ?  ?PALPATION: ?10/11/2021 ?TTP and pain anterior knee ?  ?LE ROM: ?  ?AROM/PROM Right ?10/11/2021 Right ?10/13/2021   ?Hip flexion      ?  Hip extension      ?Hip abduction      ?Hip adduction      ?Hip internal rotation      ?Hip external rotation      ?Knee flexion 75/80 Supine heel slide AROM: 74 ?   ?Knee extension 1/0  Seated AROM LAQ : -30 c pain  ?Ankle dorsiflexion      ?Ankle plantarflexion      ?Ankle inversion      ?Ankle eversion      ? (Blank rows = not tested) ?  ?LE MMT: ?  ?MMT in sitting Right ?10/11/2021    ?Hip flexion 3/5    ?Hip extension      ?Hip abduction 3/5    ?Hip adduction      ?Hip internal rotation      ?Hip external rotation      ?Knee flexion 3/5    ?Knee extension 25/5    ?Ankle dorsiflexion      ?Ankle plantarflexion      ?Ankle inversion      ?Ankle eversion      ? (Blank rows = not tested) ?  ?FUNCTIONAL TESTS:  ?  ?  ?GAIT: ?10/11/2021 ?Distance walked:  75 ?Assistive device utilized: Environmental consultant - 2 wheeled ?Level of assistance: supervision ?Comments: RW too short but is at its tallest level it can be adjusted, she has decreased hip/knee flexion and weight shift to Rt ?  ?  ?  ?TODAY'S TREATMENT: ?10/13/2021 ?Therex: ? Nustep Lvl 5 range to tolerance UE/LE ? Seated Rt Leg hang into flexion 15 seconds x 3 ? Seated alternating isometrics ext/flexion in 45 deg knee flexion 5 sec for 18 x each way ? Seated Rt knee LAQ to tolerance into extension and flexion 2 x 10 c contralateral leg movement opposite.  ? Supine bilateral quad set c towel under Rt knee for compression 5 sec hold x 15 ? Supine AROM heel slide 2 x 10 to tolerance Rt knee ? ? ?Manual ? Seated Rt knee flexion stretch with overpressure, Rt knee distraction c IR and flexion ? ?Vasopneumatic ? ?10/11/2021 ?10 reps of HEP listed below ?Knee flexion LLLD stretch over bolster and yellow ball X 2 min ?Nu step X 5 min L5 seat 8 ?Manual therapy for Rt knee PROM and flexion mobs ?  ?  ?PATIENT EDUCATION:  ?10/11/2021 ?Education details: HEP,POC, ?Person educated: Patient and Spouse ?Education method: Explanation, Demonstration, Verbal cues, and Handouts ?Education comprehension: verbalized understanding, returned demonstration, and needs further education ?  ?  ?HOME EXERCISE PROGRAM: ?10/11/2021 ?Access Code: [email or web address] ?URL: [email or web address] ?Date: 10/11/2021 ?Prepared by: Elsie Ra ?  ?Exercises ?- Supine Heel Slide with Strap  - 2 x daily - 6 x weekly - 1-2 sets - 10 reps - 5 hold ?- Supine Quadricep Sets  - 2 x daily - 6 x weekly - 1-2 sets - 10 reps - 5 sec hold ?- Seated Long Arc Quad  - 2 x daily - 6 x weekly - 2-3 sets - 10 reps ?- Seated Knee Flexion Stretch  - 2 x daily - 6 x weekly - 1-2 sets - 10 reps - 5 sec hold ?- Seated Hamstring Stretch  - 2 x daily - 6 x weekly - 1 sets - 2-3 reps - 30 hold ?  ?            ?ASSESSMENT: ?  ?CLINICAL IMPRESSION:  ?Noted anxiety and pain response to both  active and passive Rt knee  flexion c quad strength deficits noted at this time.  Continued skilled PT services indicated to continue to improve mobility/strength.  ?  ?OBJECTIVE IMPAIRMENTS: decreased activity tolerance, difficulty walking, decreased balance, decreased endurance, decreased mobility, decreased ROM, decreased strength, impaired flexibility, impaired sLE use, postural dysfunction, and pain. ?  ?ACTIVITY LIMITATIONS: bending, lifting, carry, locomotion, cleaning, community activity, driving, and or occupation ?  ?PERSONAL FACTORS: S/P RIGHT TKA-09/19/2021, DVT and PE 2012, 2 other previous knee surgeries patella realignment and tendon reconstruction are also affecting patient's functional outcome. ?  ?REHAB POTENTIAL: good ?  ?CLINICAL DECISION MAKING: Stable/uncomplicated ?  ?EVALUATION COMPLEXITY: Low ?  ?  ?  ?GOALS: ?Short term PT Goals Target date: 11/08/2021 ?Pt will be I and compliant with HEP. ?Baseline:  ?Goal status: on going 10/13/2021 ?Pt will decrease pain by 25% overall ?Baseline: ?Goal status: on going 10/13/2021 ?  ?Long term PT goals Target date: 01/03/2022 ?Pt will improve Rt knee AROM to 0-100 degrees to improve functional mobility ?Baseline: ?Goal status: New ?Pt will improve  hip/knee strength to at least 5-/5 MMT to improve functional strength ?Baseline: ?Goal status: New ?Pt will improve FOTO to at least 59% functional to show improved function ?Baseline: ?Goal status: New ?Pt will reduce pain by overall 50% overall with usual activity ?Baseline: ?Goal status: NeW ?Pt will be able to ambulate community distances at least 1000 ft WNL gait pattern without complaints or assistance ?Baseline: ?Goal status: New ?  ?PLAN: ?PT FREQUENCY: 2-3 times per week  ?  ?PT DURATION: 12 weeks ?  ?PLANNED INTERVENTIONS (unless contraindicated): aquatic PT, Canalith repositioning, cryotherapy, Electrical stimulation, Iontophoresis with 4 mg/ml dexamethasome, Moist heat, traction, Ultrasound, gait  training, Therapeutic exercise, balance training, neuromuscular re-education, patient/family education, prosthetic training, manual techniques, passive ROM, dry needling, taping, vasopnuematic device, vestibular,

## 2021-10-17 ENCOUNTER — Encounter: Payer: Self-pay | Admitting: Physical Therapy

## 2021-10-17 ENCOUNTER — Telehealth: Payer: Self-pay | Admitting: Orthopaedic Surgery

## 2021-10-17 ENCOUNTER — Other Ambulatory Visit: Payer: Self-pay | Admitting: Physician Assistant

## 2021-10-17 ENCOUNTER — Other Ambulatory Visit: Payer: Self-pay

## 2021-10-17 ENCOUNTER — Ambulatory Visit (INDEPENDENT_AMBULATORY_CARE_PROVIDER_SITE_OTHER): Admitting: Physical Therapy

## 2021-10-17 DIAGNOSIS — M25661 Stiffness of right knee, not elsewhere classified: Secondary | ICD-10-CM

## 2021-10-17 DIAGNOSIS — M6281 Muscle weakness (generalized): Secondary | ICD-10-CM

## 2021-10-17 DIAGNOSIS — R2689 Other abnormalities of gait and mobility: Secondary | ICD-10-CM | POA: Diagnosis not present

## 2021-10-17 DIAGNOSIS — R600 Localized edema: Secondary | ICD-10-CM

## 2021-10-17 DIAGNOSIS — M25561 Pain in right knee: Secondary | ICD-10-CM | POA: Diagnosis not present

## 2021-10-17 MED ORDER — OXYCODONE-ACETAMINOPHEN 5-325 MG PO TABS: 1.0000 | ORAL_TABLET | Freq: Four times a day (QID) | ORAL | 0 refills | Status: DC | PRN

## 2021-10-17 NOTE — Telephone Encounter (Signed)
Please call in Percocet  ?

## 2021-10-17 NOTE — Therapy (Signed)
?OUTPATIENT PHYSICAL THERAPY TREATMENT NOTE ? ? ?Patient Name: Sophia Long ?MRN: [id number] ?DOB:Aug 05, 1980, 41 y.o., female ?Today's Date: 10/17/2021 ? ? ?END OF SESSION:  ? PT End of Session - 10/17/21 1313   ? ? Visit Number 3   ? Number of Visits 15   ? Date for PT Re-Evaluation 01/03/22   ? Authorization Type [employer]   ? PT Start Time 1309   ? PT Stop Time 1402   ? PT Time Calculation (min) 53 min   ? Activity Tolerance Patient tolerated treatment well;Patient limited by pain   ? Behavior During Therapy [facility] for tasks assessed/performed;Anxious   ? ?  ?  ? ?  ? ? ? ?Past Medical History:  ?Diagnosis Date  ? 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 ADHD (attention deficit hyperactivity disorder)   ? Asthma   ? Crohn's disease ([place]) 2000  ? DVT (deep venous thrombosis) ([place]) 2012  ? History of hiatal hernia 2018  ? Pulmonary embolism ([place]) 2012  ? ?Past Surgical History:  ?Procedure Laterality Date  ? ABDOMINAL HYSTERECTOMY  2009  ? partial  ? CESAREAN SECTION  2002  ? x 4 (2002, 2004, 2005, 2008)  ? CHOLECYSTECTOMY  2000  ? GASTRIC BYPASS  2019  ? HARDWARE REMOVAL Left 2015  ? knee  ? KNEE SURGERY Right 2012  ? KNEE SURGERY Left 2013  ? [place] RESECTION  2018  ? TENDON RECONSTRUCTION Right 2015  ? TOTAL KNEE ARTHROPLASTY Right 09/19/2021  ? Procedure: RIGHT TOTAL KNEE ARTHROPLASTY;  Surgeon: Leandrew Koyanagi, MD;  Location: [place];  Service: Orthopedics;  Laterality: Right;  ? TUBAL LIGATION  2005  ? ?Patient Active Problem List  ? Diagnosis Date Noted  ? 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 Primary osteoarthritis of right knee 08/25/2021  ? Primary osteoarthritis of left knee 08/25/2021  ? 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 Asthma 08/15/2021  ? Hearing loss of left ear 08/15/2021  ? Encounter to establish care 08/15/2021  ? History of COVID-19 08/15/2021  ? Acute cough 08/15/2021  ? ? ?REFERRING PROVIDER: Leandrew Koyanagi,  MD ?  ?REFERRING DIAG: X73.532 (ICD-10-CM) - Hx of total knee replacement, right ? ?ONSET DATE: 09/19/21 ? ?THERAPY DIAG:  ?Acute pain of right knee ? ?Stiffness of right knee, not elsewhere classified ? ?Muscle weakness (generalized) ? ?Other abnormalities of gait and mobility ? ?Localized edema ? ?PERTINENT HISTORY: Rt TKA 09/19/21, DVT and PE 2012, 2 other previous knee surgeries patella realighmenta nd tendon reconstruction ? ?PRECAUTIONS: None ? ?SUBJECTIVE: She has been doing her exercises. Yesterday she seemed to be stiff & sore on Sunday. She reports that she increased activity level on Saturday.  ? ?PAIN:  ?Are you having pain? Yes:  ?NPRS scale:  6/10 upon arrival to PT.  In last week lowest 3/10 & highest 8/10 ?Pain location: Rt anterior knee ?Pain description: dull and achy but sharp if flexed too much ?Aggravating factors: static positioning, end range complaints.  ?Relieving factors: moving, ice, meds ? ? ?OBJECTIVE:  ?  ?PATIENT SURVEYS:  ?10/11/2021: FOTO 35% functional ?  ?MUSCLE LENGTH: ?10/11/2021:  Tight H.S, and quads on Rt ?  ?PALPATION: ?10/11/2021:  TTP and pain anterior knee ? ?LE ROM: ?  ?AROM/PROM Right ?10/11/2021 Right ?10/13/2021   ?Hip flexion      ?Hip extension      ?Hip abduction      ?  Hip adduction      ?Hip internal rotation      ?Hip external rotation      ?Knee flexion 75/80 Supine heel slide AROM: 74 ?   ?Knee extension 1/0  Seated AROM LAQ : -30 c pain  ?Ankle dorsiflexion      ?Ankle plantarflexion      ?Ankle inversion      ?Ankle eversion      ? (Blank rows = not tested) ?  ?LE MMT: ?  ?MMT in sitting Right ?10/11/2021    ?Hip flexion 3/5    ?Hip extension      ?Hip abduction 3/5    ?Hip adduction      ?Hip internal rotation      ?Hip external rotation      ?Knee flexion 3/5    ?Knee extension 2/5    ?Ankle dorsiflexion      ?Ankle plantarflexion      ?Ankle inversion      ?Ankle eversion      ? (Blank rows = not tested) ?  ?FUNCTIONAL TESTS:  ?  ?  ?GAIT: ?10/11/2021:   Distance  walked: 75 ?Assistive device utilized: Environmental consultant - 2 wheeled ?Level of assistance: supervision ?Comments: RW too short but is at its tallest level it can be adjusted, she has decreased hip/knee flexion and weight shift to Rt ?  ?  ?  ?TODAY'S TREATMENT: ?10/17/2021 ?Therapeutic Exercise: ?Seated heel slides with 5 sec hold flex & ext for 5 min.  ?Nustep seat 7 level 5 with BLEs & BUEs with focus on range. After 3 min target >25spm.  Total of 8 min.  ?Gastroc stretch on incline board 30 sec hold 2 reps.  ?Bil heel raises on incline board with RW support 10 reps. ? ?Neuromuscular Re-ed: ?Tandem stance on foam RLE in front 30 sec & RLE in back 30 sec. ? ?Gait with RW. PT demo & verbal cues on terminal stance knee flexion, initial contact with heel, weight shift over RLE in stance & step length. Pt ambulated with verbal cues. She is anxious about moving RLE.  ? ?Manual Therapy:   ? ?Therapeutic Activities:  PT verbally cueing pt to move RLE in/out of bed and on/off Nustep without using other LEs or hands. She did require PT tactile cues to overcome anxiety. ? ?Self-Care:   ? ?Vasopneumatic right knee medium compression 34* 15 min.  ? ?10/13/2021 ?Therex: ? Nustep Lvl 5 range to tolerance UE/LE ? Seated Rt Leg hang into flexion 15 seconds x 3 ? Seated alternating isometrics ext/flexion in 45 deg knee flexion 5 sec for 18 x each way ? Seated Rt knee LAQ to tolerance into extension and flexion 2 x 10 c contralateral leg movement opposite.  ? Supine bilateral quad set c towel under Rt knee for compression 5 sec hold x 15 ? Supine AROM heel slide 2 x 10 to tolerance Rt knee ? ? ?Manual ? Seated Rt knee flexion stretch with overpressure, Rt knee distraction c IR and flexion ? ?Vasopneumatic ? ?10/11/2021 ?10 reps of HEP listed below ?Knee flexion LLLD stretch over bolster and yellow ball X 2 min ?Nu step X 5 min L5 seat 8 ?Manual therapy for Rt knee PROM and flexion mobs ?  ?  ?PATIENT EDUCATION:  ?10/11/2021 ?Education details:  HEP,POC, ?Person educated: Patient and Spouse ?Education method: Explanation, Demonstration, Verbal cues, and Handouts ?Education comprehension: verbalized understanding, returned demonstration, and needs further education ?  ?  ?HOME EXERCISE PROGRAM: ?  10/11/2021 ?Access Code: [email or web address] ?URL: [email or web address] ?Date: 10/11/2021 ?Prepared by: Elsie Ra ?  ?Exercises ?- Supine Heel Slide with Strap  - 2 x daily - 6 x weekly - 1-2 sets - 10 reps - 5 hold ?- Supine Quadricep Sets  - 2 x daily - 6 x weekly - 1-2 sets - 10 reps - 5 sec hold ?- Seated Long Arc Quad  - 2 x daily - 6 x weekly - 2-3 sets - 10 reps ?- Seated Knee Flexion Stretch  - 2 x daily - 6 x weekly - 1-2 sets - 10 reps - 5 sec hold ?- Seated Hamstring Stretch  - 2 x daily - 6 x weekly - 1 sets - 2-3 reps - 30 hold ?  ?            ?ASSESSMENT: ?  ?CLINICAL IMPRESSION:  ?Patient continues to be anxious with any movement of right knee.  She improved knee motion with gait with instructions.  Her passive knee range improved compared to last week.  She continues to benefit from skilled PT.  ?  ?OBJECTIVE IMPAIRMENTS: decreased activity tolerance, difficulty walking, decreased balance, decreased endurance, decreased mobility, decreased ROM, decreased strength, impaired flexibility, impaired sLE use, postural dysfunction, and pain. ?  ?ACTIVITY LIMITATIONS: bending, lifting, carry, locomotion, cleaning, community activity, driving, and or occupation ?  ?PERSONAL FACTORS: S/P RIGHT TKA-09/19/2021, DVT and PE 2012, 2 other previous knee surgeries patella realignment and tendon reconstruction are also affecting patient's functional outcome. ?  ?REHAB POTENTIAL: good ?  ?CLINICAL DECISION MAKING: Stable/uncomplicated ?  ?EVALUATION COMPLEXITY: Low ?  ?GOALS: ?Short term PT Goals Target date: 11/08/2021 ?Pt will be I and compliant with HEP. ?Baseline:  ?Goal status: on going 10/13/2021 ?Pt will decrease pain by 25% overall ?Baseline: ?Goal status: on  going 10/13/2021 ?  ?Long term PT goals Target date: 01/03/2022 ?Pt will improve Rt knee AROM to 0-100 degrees to improve functional mobility ?Baseline: ?Goal status: New ?Pt will improve  hip/knee strength to at

## 2021-10-17 NOTE — Telephone Encounter (Signed)
Sent mychart msg.

## 2021-10-17 NOTE — Telephone Encounter (Signed)
Sent in

## 2021-10-19 ENCOUNTER — Ambulatory Visit (INDEPENDENT_AMBULATORY_CARE_PROVIDER_SITE_OTHER): Admitting: Rehabilitative and Restorative Service Providers"

## 2021-10-19 ENCOUNTER — Encounter: Payer: Self-pay | Admitting: Rehabilitative and Restorative Service Providers"

## 2021-10-19 DIAGNOSIS — M25661 Stiffness of right knee, not elsewhere classified: Secondary | ICD-10-CM

## 2021-10-19 DIAGNOSIS — M6281 Muscle weakness (generalized): Secondary | ICD-10-CM | POA: Diagnosis not present

## 2021-10-19 DIAGNOSIS — R600 Localized edema: Secondary | ICD-10-CM

## 2021-10-19 DIAGNOSIS — R2689 Other abnormalities of gait and mobility: Secondary | ICD-10-CM | POA: Diagnosis not present

## 2021-10-19 DIAGNOSIS — M25561 Pain in right knee: Secondary | ICD-10-CM | POA: Diagnosis not present

## 2021-10-19 NOTE — Therapy (Signed)
?OUTPATIENT PHYSICAL THERAPY TREATMENT NOTE ? ? ?Patient Name: Sophia Long ?MRN: [id number] ?DOB:[birth year], 41 y.o., female ?Today's Date: 10/19/2021 ? ? ?END OF SESSION:  ? PT End of Session - 10/19/21 1211   ? ? Visit Number 4   ? Number of Visits 15   ? Date for PT Re-Evaluation 01/03/22   ? Authorization Type [employer]   ? PT Start Time 1145   ? PT Stop Time 1236   ? PT Time Calculation (min) 51 min   ? Activity Tolerance Patient tolerated treatment well;Patient limited by pain   ? Behavior During Therapy [facility] for tasks assessed/performed   ? ?  ?  ? ?  ? ? ? ? ?Past Medical History:  ?Diagnosis Date  ? 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 ADHD (attention deficit hyperactivity disorder)   ? Asthma   ? Crohn's disease ([place]) 2000  ? DVT (deep venous thrombosis) ([place]) 2012  ? History of hiatal hernia 2018  ? Pulmonary embolism ([place]) 2012  ? ?Past Surgical History:  ?Procedure Laterality Date  ? ABDOMINAL HYSTERECTOMY  2009  ? partial  ? CESAREAN SECTION  2002  ? x 4 (2002, 2004, 2005, 2008)  ? CHOLECYSTECTOMY  2000  ? GASTRIC BYPASS  2019  ? HARDWARE REMOVAL Left 2015  ? knee  ? KNEE SURGERY Right 2012  ? KNEE SURGERY Left 2013  ? [place] RESECTION  2018  ? TENDON RECONSTRUCTION Right 2015  ? TOTAL KNEE ARTHROPLASTY Right 09/19/2021  ? Procedure: RIGHT TOTAL KNEE ARTHROPLASTY;  Surgeon: Leandrew Koyanagi, MD;  Location: [place];  Service: Orthopedics;  Laterality: Right;  ? TUBAL LIGATION  2005  ? ?Patient Active Problem List  ? Diagnosis Date Noted  ? 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 Primary osteoarthritis of right knee 08/25/2021  ? Primary osteoarthritis of left knee 08/25/2021  ? 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 Asthma 08/15/2021  ? Hearing loss of left ear 08/15/2021  ? Encounter to establish care 08/15/2021  ? History of COVID-19 08/15/2021  ? Acute cough 08/15/2021  ? ? ?REFERRING PROVIDER: Leandrew Koyanagi, MD ?   ?REFERRING DIAG: R44.315 (ICD-10-CM) - Hx of total knee replacement, right ? ?ONSET DATE: 09/19/21 ? ?THERAPY DIAG:  ?Other abnormalities of gait and mobility ? ?Muscle weakness (generalized) ? ?Stiffness of right knee, not elsewhere classified ? ?Acute pain of right knee ? ?Localized edema ? ?PERTINENT HISTORY: Rt TKA 09/19/21, DVT and PE 2012, 2 other previous knee surgeries patella realighmenta nd tendon reconstruction ? ?PRECAUTIONS: None ? ?SUBJECTIVE: Joory reports about 4 hours of sleep uninterrupted.  She reports good HEP compliance. ? ?PAIN:  ?Are you having pain? Yes:  ?NPRS scale:  6/10 at worst this week.  In last week lowest 3/10 & highest 6/10 ?Pain location: Rt anterior knee ?Pain description: dull and achy but sharp if flexed too much ?Aggravating factors: static positioning, end range complaints.  ?Relieving factors: moving, ice, meds ? ? ?OBJECTIVE:  ?  ?PATIENT SURVEYS:  ?10/11/2021: FOTO 35% functional ?  ?MUSCLE LENGTH: ?10/11/2021:  Tight H.S, and quads on Rt ?  ?PALPATION: ?10/11/2021:  TTP and pain anterior knee ? ?LE ROM: ?  ?AROM/PROM Right ?10/11/2021 Right ?10/13/2021   ?Hip flexion      ?Hip extension      ?Hip abduction      ?Hip adduction      ?Hip internal rotation      ?  Hip external rotation      ?Knee flexion 75/80 Supine heel slide AROM: 74 ?   ?Knee extension 1/0  Seated AROM LAQ : -30 c pain  ?Ankle dorsiflexion      ?Ankle plantarflexion      ?Ankle inversion      ?Ankle eversion      ? (Blank rows = not tested) ?  ?LE MMT: ?  ?MMT in sitting Right ?10/11/2021    ?Hip flexion 3/5    ?Hip extension      ?Hip abduction 3/5    ?Hip adduction      ?Hip internal rotation      ?Hip external rotation      ?Knee flexion 3/5    ?Knee extension 2/5    ?Ankle dorsiflexion      ?Ankle plantarflexion      ?Ankle inversion      ?Ankle eversion      ? (Blank rows = not tested) ?  ?FUNCTIONAL TESTS:  ?  ?  ?GAIT: ?10/11/2021:   Distance walked: 75 ?Assistive device utilized: Environmental consultant - 2 wheeled ?Level of  assistance: supervision ?Comments: RW too short but is at its tallest level it can be adjusted, she has decreased hip/knee flexion and weight shift to Rt ?  ?  ?  ?TODAY'S TREATMENT: ?10/19/2021 ?Therapeutic Exercise: ?Recumbent bike Seat 5 for 8 minutes AAROM ? ?Tailgate knee flexion 2 minutes ?Knee flexion AAROM (L pushes R into flexion) 10X 10 seconds ?Quadriceps sets 2 sets of 10 for  5 seconds ?Seated straight leg raises 3 sets of 5 for 3 seconds (needs PT assist for the lift) ? ?Vasopneumatic 10 minutes R knee 10 minutes post-exercises High Pressure 34* ? ? ?10/17/2021 ?Therapeutic Exercise: ?Seated heel slides with 5 sec hold flex & ext for 5 min.  ?Nustep seat 7 level 5 with BLEs & BUEs with focus on range. After 3 min target >25spm.  Total of 8 min.  ?Gastroc stretch on incline board 30 sec hold 2 reps.  ?Bil heel raises on incline board with RW support 10 reps. ? ?Neuromuscular Re-ed: ?Tandem stance on foam RLE in front 30 sec & RLE in back 30 sec. ? ?Gait with RW. PT demo & verbal cues on terminal stance knee flexion, initial contact with heel, weight shift over RLE in stance & step length. Pt ambulated with verbal cues. She is anxious about moving RLE.  ? ?Manual Therapy:   ? ?Therapeutic Activities:  PT verbally cueing pt to move RLE in/out of bed and on/off Nustep without using other LEs or hands. She did require PT tactile cues to overcome anxiety. ? ?Self-Care:   ? ?Vasopneumatic right knee medium compression 34* 15 min.  ? ? ?10/13/2021 ?Therex: ? Nustep Lvl 5 range to tolerance UE/LE ? Seated Rt Leg hang into flexion 15 seconds x 3 ? Seated alternating isometrics ext/flexion in 45 deg knee flexion 5 sec for 18 x each way ? Seated Rt knee LAQ to tolerance into extension and flexion 2 x 10 c contralateral leg movement opposite.  ? Supine bilateral quad set c towel under Rt knee for compression 5 sec hold x 15 ? Supine AROM heel slide 2 x 10 to tolerance Rt knee ? ? ?Manual ? Seated Rt knee flexion stretch  with overpressure, Rt knee distraction c IR and flexion ? ?Vasopneumatic ? ?  ?PATIENT EDUCATION:  ?10/11/2021 ?Education details: HEP,POC, ?Person educated: Patient and Spouse ?Education method: Explanation, Demonstration, Verbal cues, and Handouts ?  Education comprehension: verbalized understanding, returned demonstration, and needs further education ?  ?  ?HOME EXERCISE PROGRAM: ?10/11/2021 ?Access Code: [email or web address] ?URL: [email or web address] ?Date: 10/11/2021 ?Prepared by: Elsie Ra ?  ?Exercises ?- Supine Heel Slide with Strap  - 2 x daily - 6 x weekly - 1-2 sets - 10 reps - 5 hold ?- Supine Quadricep Sets  - 2 x daily - 6 x weekly - 1-2 sets - 10 reps - 5 sec hold ?- Seated Long Arc Quad  - 2 x daily - 6 x weekly - 2-3 sets - 10 reps ?- Seated Knee Flexion Stretch  - 2 x daily - 6 x weekly - 1-2 sets - 10 reps - 5 sec hold ?- Seated Hamstring Stretch  - 2 x daily - 6 x weekly - 1 sets - 2-3 reps - 30 hold ?  ?            ?ASSESSMENT: ?  ?CLINICAL IMPRESSION:  ?Ellagrace gave great effort with her PT in the clinic.  She is self-limited with pain but did have better quadriceps activation by the end of the visit.  Continue current POC emphasizing flexion AROM, quadriceps strength and edema control. ?  ?OBJECTIVE IMPAIRMENTS: decreased activity tolerance, difficulty walking, decreased balance, decreased endurance, decreased mobility, decreased ROM, decreased strength, impaired flexibility, impaired sLE use, postural dysfunction, and pain. ?  ?ACTIVITY LIMITATIONS: bending, lifting, carry, locomotion, cleaning, community activity, driving, and or occupation ?  ?PERSONAL FACTORS: S/P RIGHT TKA-09/19/2021, DVT and PE 2012, 2 other previous knee surgeries patella realignment and tendon reconstruction are also affecting patient's functional outcome. ?  ?REHAB POTENTIAL: good ?  ?CLINICAL DECISION MAKING: Stable/uncomplicated ?  ?EVALUATION COMPLEXITY: Low ?  ?GOALS: ?Short term PT Goals Target date:  11/08/2021 ?Pt will be I and compliant with HEP. ?Baseline:  ?Goal status: Met 10/19/2021 ?Pt will decrease pain by 25% overall ?Baseline: ?Goal status: on going 10/13/2021 ?  ?Long term PT goals Target date: 7/18/202

## 2021-10-20 ENCOUNTER — Encounter: Payer: Self-pay | Admitting: Physical Therapy

## 2021-10-20 ENCOUNTER — Ambulatory Visit (INDEPENDENT_AMBULATORY_CARE_PROVIDER_SITE_OTHER): Admitting: Physical Therapy

## 2021-10-20 DIAGNOSIS — M25561 Pain in right knee: Secondary | ICD-10-CM

## 2021-10-20 DIAGNOSIS — R2689 Other abnormalities of gait and mobility: Secondary | ICD-10-CM | POA: Diagnosis not present

## 2021-10-20 DIAGNOSIS — M25661 Stiffness of right knee, not elsewhere classified: Secondary | ICD-10-CM

## 2021-10-20 DIAGNOSIS — M6281 Muscle weakness (generalized): Secondary | ICD-10-CM

## 2021-10-20 DIAGNOSIS — R600 Localized edema: Secondary | ICD-10-CM

## 2021-10-20 NOTE — Therapy (Signed)
?OUTPATIENT PHYSICAL THERAPY TREATMENT NOTE ? ? ?Patient Name: Sophia Long ?MRN: [id number] ?DOB:12-Dec-1980, 41 y.o., female ?Today's Date: 10/20/2021 ? ? ?END OF SESSION:  ? PT End of Session - 10/20/21 1349   ? ? Visit Number 5   ? Number of Visits 15   ? Date for PT Re-Evaluation 01/03/22   ? Authorization Type [employer]   ? PT Start Time 1345   ? PT Stop Time 6213   ? PT Time Calculation (min) 48 min   ? Activity Tolerance Patient tolerated treatment well;Patient limited by pain   ? Behavior During Therapy [facility] for tasks assessed/performed   ? ?  ?  ? ?  ? ? ? ? ? ?Past Medical History:  ?Diagnosis Date  ? 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 ADHD (attention deficit hyperactivity disorder)   ? Asthma   ? Crohn's disease ([place]) 2000  ? DVT (deep venous thrombosis) ([place]) 2012  ? History of hiatal hernia 2018  ? Pulmonary embolism ([place]) 2012  ? ?Past Surgical History:  ?Procedure Laterality Date  ? ABDOMINAL HYSTERECTOMY  2009  ? partial  ? CESAREAN SECTION  2002  ? x 4 (2002, 2004, 2005, 2008)  ? CHOLECYSTECTOMY  2000  ? GASTRIC BYPASS  2019  ? HARDWARE REMOVAL Left 2015  ? knee  ? KNEE SURGERY Right 2012  ? KNEE SURGERY Left 2013  ? [place] RESECTION  2018  ? TENDON RECONSTRUCTION Right 2015  ? TOTAL KNEE ARTHROPLASTY Right 09/19/2021  ? Procedure: RIGHT TOTAL KNEE ARTHROPLASTY;  Surgeon: Leandrew Koyanagi, MD;  Location: [place];  Service: Orthopedics;  Laterality: Right;  ? TUBAL LIGATION  2005  ? ?Patient Active Problem List  ? Diagnosis Date Noted  ? 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 Primary osteoarthritis of right knee 08/25/2021  ? Primary osteoarthritis of left knee 08/25/2021  ? 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 Asthma 08/15/2021  ? Hearing loss of left ear 08/15/2021  ? Encounter to establish care 08/15/2021  ? History of COVID-19 08/15/2021  ? Acute cough 08/15/2021  ? ? ?REFERRING PROVIDER: Leandrew Koyanagi, MD ?   ?REFERRING DIAG: Y86.578 (ICD-10-CM) - Hx of total knee replacement, right ? ?ONSET DATE: 09/19/21 ? ?THERAPY DIAG:  ?Other abnormalities of gait and mobility ? ?Muscle weakness (generalized) ? ?Stiffness of right knee, not elsewhere classified ? ?Acute pain of right knee ? ?Localized edema ? ?PERTINENT HISTORY: Rt TKA 09/19/21, DVT and PE 2012, 2 other previous knee surgeries patella realighmenta nd tendon reconstruction ? ?PRECAUTIONS: None ? ?SUBJECTIVE: knee is stiff today; otherwise doing okay  ? ?PAIN:  ?Are you having pain? Yes:  ?NPRS scale:  4/10 at worst this week.  In last week lowest 3/10 & highest 6/10 ?Pain location: Rt anterior knee ?Pain description: dull and achy but sharp if flexed too much ?Aggravating factors: static positioning, end range complaints.  ?Relieving factors: moving, ice, meds ? ? ?OBJECTIVE:  ?  ?PATIENT SURVEYS:  ?10/11/2021: FOTO 35% functional ?  ?MUSCLE LENGTH: ?10/11/2021:  Tight H.S, and quads on Rt ?  ?PALPATION: ?10/11/2021:  TTP and pain anterior knee ? ?LE ROM: ?  ?AROM/PROM Right ?10/11/2021 Right ?10/13/2021  Right ?10/20/21  ?Hip flexion       ?Hip extension       ?Hip abduction       ?Hip adduction       ?Hip internal rotation       ?  Hip external rotation       ?Knee flexion 75/80 Supine heel slide AROM: 74 ?  Supine AA: ?83  ?Knee extension 1/0  Seated AROM LAQ : -30 c pain   ?Ankle dorsiflexion       ?Ankle plantarflexion       ?Ankle inversion       ?Ankle eversion       ? (Blank rows = not tested) ?  ?LE MMT: ?  ?MMT in sitting Right ?10/11/2021  ?Hip flexion 3/5  ?Hip extension    ?Hip abduction 3/5  ?Hip adduction    ?Hip internal rotation    ?Hip external rotation    ?Knee flexion 3/5  ?Knee extension 2/5  ?Ankle dorsiflexion    ?Ankle plantarflexion    ?Ankle inversion    ?Ankle eversion    ? (Blank rows = not tested) ?  ?FUNCTIONAL TESTS:  ?  ?  ?GAIT: ?10/11/2021:   Distance walked: 75 ?Assistive device utilized: Environmental consultant - 2 wheeled ?Level of assistance:  supervision ?Comments: RW too short but is at its tallest level it can be adjusted, she has decreased hip/knee flexion and weight shift to Rt ?  ?  ?  ?TODAY'S TREATMENT: ?10/20/21 ?Therex: ?     Aerobic: ?Recumbent bike x 8 min; seat 5; partial revolutions ?    Sitting: ?Tailgate flexion x 2 min ?AA Rt knee flexion 10 x 10 sec hold (LLE providing overpressure) ? ?    Supine: ?Holding hip in 90 deg flexion: AA knee extension with relax to flexion - not formally measured but able to get to near 90 deg (PT supporting lower leg) ?AA heel slides x 10 reps on Rt ?Quad sets performed bil x 10 reps ?Gait: ?Amb 56' with SPC and HHA with min A and cues for sequening ? ?Manual: ?Rt knee flexion in supine and sitting, use of percussive device on quad to help with decreasing guarding ?Modalities: ?Vaso x 10 min; Rt knee; medium pressure, 34 deg ? ? ?10/19/2021 ?Therapeutic Exercise: ?Recumbent bike Seat 5 for 8 minutes AAROM ? ?Tailgate knee flexion 2 minutes ?Knee flexion AAROM (L pushes R into flexion) 10X 10 seconds ?Quadriceps sets 2 sets of 10 for  5 seconds ?Seated straight leg raises 3 sets of 5 for 3 seconds (needs PT assist for the lift) ? ?Vasopneumatic 10 minutes R knee 10 minutes post-exercises High Pressure 34* ? ? ?10/17/2021 ?Therapeutic Exercise: ?Seated heel slides with 5 sec hold flex & ext for 5 min.  ?Nustep seat 7 level 5 with BLEs & BUEs with focus on range. After 3 min target >25spm.  Total of 8 min.  ?Gastroc stretch on incline board 30 sec hold 2 reps.  ?Bil heel raises on incline board with RW support 10 reps. ? ?Neuromuscular Re-ed: ?Tandem stance on foam RLE in front 30 sec & RLE in back 30 sec. ? ?Gait with RW. PT demo & verbal cues on terminal stance knee flexion, initial contact with heel, weight shift over RLE in stance & step length. Pt ambulated with verbal cues. She is anxious about moving RLE.  ? ?Manual Therapy:   ? ?Therapeutic Activities:  PT verbally cueing pt to move RLE in/out of bed and  on/off Nustep without using other LEs or hands. She did require PT tactile cues to overcome anxiety. ? ?Self-Care:   ? ?Vasopneumatic right knee medium compression 34* 15 min.  ? ? ?10/13/2021 ?Therex: ? Nustep Lvl 5 range to tolerance UE/LE ?  Seated Rt Leg hang into flexion 15 seconds x 3 ? Seated alternating isometrics ext/flexion in 45 deg knee flexion 5 sec for 18 x each way ? Seated Rt knee LAQ to tolerance into extension and flexion 2 x 10 c contralateral leg movement opposite.  ? Supine bilateral quad set c towel under Rt knee for compression 5 sec hold x 15 ? Supine AROM heel slide 2 x 10 to tolerance Rt knee ? ? ?Manual ? Seated Rt knee flexion stretch with overpressure, Rt knee distraction c IR and flexion ? ?Vasopneumatic ? ?  ?PATIENT EDUCATION:  ?10/11/2021 ?Education details: HEP,POC, ?Person educated: Patient and Spouse ?Education method: Explanation, Demonstration, Verbal cues, and Handouts ?Education comprehension: verbalized understanding, returned demonstration, and needs further education ?  ?  ?HOME EXERCISE PROGRAM: ?10/11/2021 ?Access Code: [email or web address] ?URL: [email or web address] ?Date: 10/11/2021 ?Prepared by: Elsie Ra ?  ?Exercises ?- Supine Heel Slide with Strap  - 2 x daily - 6 x weekly - 1-2 sets - 10 reps - 5 hold ?- Supine Quadricep Sets  - 2 x daily - 6 x weekly - 1-2 sets - 10 reps - 5 sec hold ?- Seated Long Arc Quad  - 2 x daily - 6 x weekly - 2-3 sets - 10 reps ?- Seated Knee Flexion Stretch  - 2 x daily - 6 x weekly - 1-2 sets - 10 reps - 5 sec hold ?- Seated Hamstring Stretch  - 2 x daily - 6 x weekly - 1 sets - 2-3 reps - 30 hold ?  ?            ?ASSESSMENT: ?  ?CLINICAL IMPRESSION:  ?Pt tolerated session well today, but tearful at times working on knee flexion.  She's mostly limited by pain at this time and feel knee flexion will improve as pain is better controlled.  Will continue to benefit from PT to maximize function. ?  ?OBJECTIVE IMPAIRMENTS: decreased  activity tolerance, difficulty walking, decreased balance, decreased endurance, decreased mobility, decreased ROM, decreased strength, impaired flexibility, impaired sLE use, postural dysfunction, and pain. ?  ?ACTIVITY LIMITATIO

## 2021-10-21 ENCOUNTER — Ambulatory Visit (AMBULATORY_SURGERY_CENTER): Admitting: Gastroenterology

## 2021-10-21 ENCOUNTER — Encounter: Payer: Self-pay | Admitting: Gastroenterology

## 2021-10-21 VITALS — BP 122/71 | HR 54 | Temp 98.9°F | Resp 11 | Ht 63.0 in | Wt 202.0 lb

## 2021-10-21 DIAGNOSIS — K449 Diaphragmatic hernia without obstruction or gangrene: Secondary | ICD-10-CM | POA: Diagnosis not present

## 2021-10-21 DIAGNOSIS — K219 Gastro-esophageal reflux disease without esophagitis: Secondary | ICD-10-CM

## 2021-10-21 MED ORDER — SODIUM CHLORIDE 0.9 % IV SOLN: 500.0000 mL | Freq: Once | INTRAVENOUS | Status: DC

## 2021-10-21 NOTE — Progress Notes (Signed)
Pt's states no medical or surgical changes since previsit or office visit. 

## 2021-10-21 NOTE — Op Note (Signed)
[place] ?Patient Name: Sophia Long ?Procedure Date: 10/21/2021 10:34 AM ?MRN: [id number] ?Endoscopist: Ayiden Milliman E. Candis Schatz , MD ?Age: 41 ?Referring MD:  ?Date of Birth: [birth year] ?Gender: Female ?Account #: [id number] ?Procedure:                Upper GI endoscopy ?Indications:              Esophageal reflux symptoms that persist despite  ?                          appropriate therapy ?Medicines:                Monitored Anesthesia Care ?Procedure:                Pre-Anesthesia Assessment: ?                          - Prior to the procedure, a History and Physical  ?                          was performed, and patient medications and  ?                          allergies were reviewed. The patient's tolerance of  ?                          previous anesthesia was also reviewed. The risks  ?                          and benefits of the procedure and the sedation  ?                          options and risks were discussed with the patient.  ?                          All questions were answered, and informed consent  ?                          was obtained. Prior Anticoagulants: The patient has  ?                          taken Xarelto (rivaroxaban), last dose was 2 days  ?                          prior to procedure. ASA Grade Assessment: III - A  ?                          patient with severe systemic disease. After  ?                          reviewing the risks and benefits, the patient was  ?                          deemed in satisfactory condition to undergo the  ?  procedure. ?                          After obtaining informed consent, the endoscope was  ?                          passed under direct vision. Throughout the  ?                          procedure, the patient's blood pressure, pulse, and  ?                          oxygen saturations were monitored continuously. The  ?                          Endoscope was introduced through the mouth, and  ?                           advanced to the efferent jejunal loop. The upper GI  ?                          endoscopy was accomplished without difficulty. The  ?                          patient tolerated the procedure well. ?Scope In: ?Scope Out: ?Findings:                 The examined portions of the nasopharynx,  ?                          oropharynx and larynx were normal. ?                          The examined esophagus was normal. ?                          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                          the gastrojejunal anastomosis was found. The  ?                          gastrojejunal anastomosis was characterized by  ?                          healthy appearing mucosa. This was traversed. ?                          A 3 cm hiatal hernia was present. ?                          Normal mucosa was found in the entire examined  ?  stomach. ?                          The examined jejunum was normal. ?Complications:            No immediate complications. ?Estimated Blood Loss:     Estimated blood loss: none. ?Impression:               - The examined portions of the nasopharynx,  ?                          oropharynx and larynx were normal. ?                          - Normal esophagus. ?                          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  ?                          Gastrojejunal anastomosis characterized by healthy  ?                          appearing mucosa. ?                          - 3 cm hiatal hernia. ?                          - Normal mucosa was found in the entire stomach. ?                          - Normal examined jejunum. ?                          - No specimens collected. ?Recommendation:           - Patient has a contact number available for  ?                          emergencies. The signs and symptoms of potential  ?                          delayed complications  were discussed with the  ?                          patient. Return to normal activities tomorrow.  ?                          Written discharge instructions were provided to the  ?                          patient. ?                          - Resume previous diet. ?                          -  Continue present medications. ?                          - Continue medical therapy and behavioral  ?                          modifications for reflux. Revision surgery could  ?                          also be considered if desired ?Tara Wich E. Candis Schatz, MD ?10/21/2021 10:52:44 AM ?This report has been signed electronically. ?

## 2021-10-21 NOTE — Progress Notes (Signed)
[facility] History and Physical ? ? ?Primary Care Physician:  Dorna Mai, MD ? ? ?Reason for Procedure:   GERD symptoms despite PPI  ? ?Plan:    EGD ? ? ? ? ?HPI: Sophia Long is a 41 y.o. female undergoing EGD to evaluate persistent chronic GERD symptoms.  She has been having daily symptoms despite daily omeprazole.  Gaviscon was added at night which has helped.  She had undergone a gastric sleeve which was converted to a gastric bypass because of GERD symptoms, but her symptoms have persisted despite the bypass.  ? ? ?Past Medical History:  ?Diagnosis Date  ? 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 ADHD (attention deficit hyperactivity disorder)   ? Asthma   ? Crohn's disease ([place]) 2000  ? DVT (deep venous thrombosis) ([place]) 2012  ? History of hiatal hernia 2018  ? Pulmonary embolism ([place]) 2012  ? ? ?Past Surgical History:  ?Procedure Laterality Date  ? ABDOMINAL HYSTERECTOMY  2009  ? partial  ? CESAREAN SECTION  2002  ? x 4 (2002, 2004, 2005, 2008)  ? CHOLECYSTECTOMY  2000  ? GASTRIC BYPASS  2019  ? HARDWARE REMOVAL Left 2015  ? knee  ? KNEE SURGERY Right 2012  ? KNEE SURGERY Left 2013  ? [place] RESECTION  2018  ? TENDON RECONSTRUCTION Right 2015  ? TOTAL KNEE ARTHROPLASTY Right 09/19/2021  ? Procedure: RIGHT TOTAL KNEE ARTHROPLASTY;  Surgeon: Leandrew Koyanagi, MD;  Location: [facility];  Service: Orthopedics;  Laterality: Right;  ? TUBAL LIGATION  2005  ? ? ?Prior to Admission medications   ?Medication Sig Start Date End Date Taking? Authorizing Provider  ?alum & mag hydroxide-simeth (MAALOX/MYLANTA) 200-200-20 MG/5ML suspension Take by mouth every 6 (six) hours as needed for indigestion or heartburn.   Yes [provider]  ?amphetamine-dextroamphetamine (ADDERALL) 30 MG tablet Take 1 tablet by mouth daily. 09/22/21  Yes Dorna Mai, MD  ?calcium carbonate (TUMS - DOSED IN MG ELEMENTAL CALCIUM) 500 MG chewable tablet Chew 1 tablet by mouth daily.   Yes [provider]   ?docusate sodium (COLACE) 100 MG capsule Take 1 capsule (100 mg total) by mouth daily as needed. 09/19/21 09/19/22 Yes Aundra Dubin, PA-C  ?HYDROmorphone (DILAUDID) 2 MG tablet Take 1 tablet (2 mg total) by mouth 3 (three) times daily as needed for severe pain. To be taken as needed for breakthrough pain 09/21/21   Aundra Dubin, PA-C  ?methocarbamol (ROBAXIN-750) 750 MG tablet Take 1 tablet (750 mg total) by mouth every 8 (eight) hours as needed for muscle spasms. 10/07/21  Yes Aundra Dubin, PA-C  ?omeprazole (PRILOSEC) 20 MG capsule Take 1 capsule (20 mg total) by mouth daily. 08/22/21  Yes Dorna Mai, MD  ?oxyCODONE-acetaminophen (PERCOCET) 5-325 MG tablet Take 1 tablet by mouth every 6 (six) hours as needed. 10/17/21  Yes Aundra Dubin, PA-C  ?rivaroxaban (XARELTO) 10 MG TABS tablet Take 1 tablet (10 mg total) by mouth daily. To prevent blood clots 09/19/21  Yes Aundra Dubin, PA-C  ?traZODone (DESYREL) 100 MG tablet Take 1 tablet (100 mg total) by mouth at bedtime. ?Patient taking differently: Take 100 mg by mouth at bedtime as needed for sleep. 08/22/21  Yes Dorna Mai, MD  ?Calcium Carbonate Antacid (ALKA-SELTZER ANTACID PO) Take by mouth.    [provider]  ?predniSONE (STERAPRED UNI-PAK 21 TAB) 10 MG (21) TBPK tablet Take as directed 10/04/21   Aundra Dubin, PA-C  ? ? ?Current Outpatient  Medications  ?Medication Sig Dispense Refill  ? alum & mag hydroxide-simeth (MAALOX/MYLANTA) 200-200-20 MG/5ML suspension Take by mouth every 6 (six) hours as needed for indigestion or heartburn.    ? amphetamine-dextroamphetamine (ADDERALL) 30 MG tablet Take 1 tablet by mouth daily. 30 tablet 0  ? calcium carbonate (TUMS - DOSED IN MG ELEMENTAL CALCIUM) 500 MG chewable tablet Chew 1 tablet by mouth daily.    ? docusate sodium (COLACE) 100 MG capsule Take 1 capsule (100 mg total) by mouth daily as needed. 30 capsule 2  ? HYDROmorphone (DILAUDID) 2 MG tablet Take 1 tablet (2 mg total) by mouth 3  (three) times daily as needed for severe pain. To be taken as needed for breakthrough pain 30 tablet 0  ? methocarbamol (ROBAXIN-750) 750 MG tablet Take 1 tablet (750 mg total) by mouth every 8 (eight) hours as needed for muscle spasms. 30 tablet 1  ? omeprazole (PRILOSEC) 20 MG capsule Take 1 capsule (20 mg total) by mouth daily. 90 capsule 1  ? oxyCODONE-acetaminophen (PERCOCET) 5-325 MG tablet Take 1 tablet by mouth every 6 (six) hours as needed. 40 tablet 0  ? rivaroxaban (XARELTO) 10 MG TABS tablet Take 1 tablet (10 mg total) by mouth daily. To prevent blood clots 30 tablet 0  ? traZODone (DESYREL) 100 MG tablet Take 1 tablet (100 mg total) by mouth at bedtime. (Patient taking differently: Take 100 mg by mouth at bedtime as needed for sleep.) 90 tablet 1  ? Calcium Carbonate Antacid (ALKA-SELTZER ANTACID PO) Take by mouth.    ? predniSONE (STERAPRED UNI-PAK 21 TAB) 10 MG (21) TBPK tablet Take as directed 21 tablet 0  ? ?Current Facility-Administered Medications  ?Medication Dose Route Frequency Provider Last Rate Last Admin  ? 0.9 %  sodium chloride infusion  500 mL Intravenous Once Daryel November, MD      ? ? ?Allergies as of 10/21/2021 - Review Complete 10/21/2021  ?Allergen Reaction Noted  ? Morphine Shortness Of Breath 11/17/2011  ? Nitrofurantoin Hives and Shortness Of Breath 07/20/2021  ? Penicillins Shortness Of Breath 11/17/2011  ? Azithromycin  09/02/2014  ? Cephalexin Hives 11/17/2011  ? Ciprofloxacin Hives 11/17/2011  ? Erythromycin Hives 11/17/2011  ? Ketorolac Hives 09/02/2014  ? Metronidazole Hives 04/23/2012  ? Ondansetron  11/17/2011  ? Other  11/17/2011  ? ? ?Family History  ?Problem Relation Age of Onset  ? Hypertension Mother   ? Diabetes Father   ? Colon cancer Maternal Uncle   ? Stomach cancer Maternal Uncle   ? ? ?Social History  ? ?Socioeconomic History  ? Marital status: Married  ?  Spouse name: Saralyn Pilar  ? Number of children: 6  ? Years of education: Not on file  ? Highest education  level: Not on file  ?Occupational History  ? Not on file  ?Tobacco Use  ? Smoking status: Every Day  ?  Packs/day: 0.20  ?  Types: Cigarettes  ? Smokeless tobacco: Never  ?Vaping Use  ? Vaping Use: Never used  ?Substance and Sexual Activity  ? Alcohol use: Not Currently  ? Drug use: Never  ? Sexual activity: Yes  ?Other Topics Concern  ? Not on file  ?Social History Narrative  ? Not on file  ? ?Social Determinants of Health  ? ?Financial Resource Strain: Not on file  ?Food Insecurity: Not on file  ?Transportation Needs: Not on file  ?Physical Activity: Not on file  ?Stress: Not on file  ?Social Connections: Not on file  ?  Intimate Partner Violence: Not on file  ? ? ?Review of Systems: ? ?All other review of systems negative except as mentioned in the HPI. ? ?Physical Exam: ?Vital signs ?BP 125/66   Pulse 68   Temp 98.9 ?F (37.2 ?C)   Ht 5' 3"  (1.6 m)   Wt 202 lb (91.6 kg)   SpO2 100%   BMI 35.78 kg/m?  ? ?General:   Alert,  Well-developed, well-nourished, pleasant and cooperative in NAD ?Airway:  Mallampati 2 ?Lungs:  Clear throughout to auscultation.   ?Heart:  Regular rate and rhythm; no murmurs, clicks, rubs,  or gallops. ?Abdomen:  Soft, nontender and nondistended. Normal bowel sounds.   ?Neuro/Psych:  Normal mood and affect. A and O x 3 ? ? ?Emile Kyllo E. Candis Schatz, MD ?[facility] ? ?

## 2021-10-21 NOTE — Patient Instructions (Signed)
Handout on hiatal hernia given to patient.  ?Resume previous diet and continue present medications. ?Continue medical therapy and behavioral modifications for reflux. Revision surgery could also be considered if desired. ? ? ?YOU HAD AN ENDOSCOPIC PROCEDURE TODAY AT [facility]:   Refer to the procedure report that was given to you for any specific questions about what was found during the examination.  If the procedure report does not answer your questions, please call your gastroenterologist to clarify.  If you requested that your care partner not be given the details of your procedure findings, then the procedure report has been included in a sealed envelope for you to review at your convenience later. ? ?YOU SHOULD EXPECT: Some feelings of bloating in the abdomen. Passage of more gas than usual.  Walking can help get rid of the air that was put into your GI tract during the procedure and reduce the bloating. If you had a lower endoscopy (such as a colonoscopy or flexible sigmoidoscopy) you may notice spotting of blood in your stool or on the toilet paper. If you underwent a bowel prep for your procedure, you may not have a normal bowel movement for a few days. ? ?Please Note:  You might notice some irritation and congestion in your nose or some drainage.  This is from the oxygen used during your procedure.  There is no need for concern and it should clear up in a day or so. ? ?SYMPTOMS TO REPORT IMMEDIATELY: ? ?Following upper endoscopy (EGD) ? Vomiting of blood or coffee ground material ? New chest pain or pain under the shoulder blades ? Painful or persistently difficult swallowing ? New shortness of breath ? Fever of 100?F or higher ? Black, tarry-looking stools ? ?For urgent or emergent issues, a gastroenterologist can be reached at any hour by calling [phone]. ?Do not use MyChart messaging for urgent concerns.  ? ? ?DIET:  We do recommend a small meal at first, but then you may  proceed to your regular diet.  Drink plenty of fluids but you should avoid alcoholic beverages for 24 hours. ? ?ACTIVITY:  You should plan to take it easy for the rest of today and you should NOT DRIVE or use heavy machinery until tomorrow (because of the sedation medicines used during the test).   ? ?FOLLOW UP: ?Our staff will call the number listed on your records 48-72 hours following your procedure to check on you and address any questions or concerns that you may have regarding the information given to you following your procedure. If we do not reach you, we will leave a message.  We will attempt to reach you two times.  During this call, we will ask if you have developed any symptoms of COVID 19. If you develop any symptoms (ie: fever, flu-like symptoms, shortness of breath, cough etc.) before then, please call [phone].  If you test positive for Covid 19 in the 2 weeks post procedure, please call and report this information to [place].   ? ?If any biopsies were taken you will be contacted by phone or by letter within the next 1-3 weeks.  Please call us at [phone] if you have not heard about the biopsies in 3 weeks.  ? ? ?SIGNATURES/CONFIDENTIALITY: ?You and/or your care partner have signed paperwork which will be entered into your electronic medical record.  These signatures attest to the fact that that the information above on your After Visit Summary has been reviewed and  is understood.  Full responsibility of the confidentiality of this discharge information lies with you and/or your care-partner.  ?

## 2021-10-21 NOTE — Progress Notes (Signed)
A and O x3. Report to RN. Tolerated MAC anesthesia well.Teeth unchanged after procedure. 

## 2021-10-24 ENCOUNTER — Other Ambulatory Visit: Payer: Self-pay | Admitting: Family Medicine

## 2021-10-24 ENCOUNTER — Encounter: Payer: Self-pay | Admitting: Family Medicine

## 2021-10-24 DIAGNOSIS — F900 Attention-deficit hyperactivity disorder, predominantly inattentive type: Secondary | ICD-10-CM

## 2021-10-24 MED ORDER — AMPHETAMINE-DEXTROAMPHETAMINE 30 MG PO TABS: 30.0000 mg | ORAL_TABLET | Freq: Every day | ORAL | 0 refills | Status: DC

## 2021-10-24 NOTE — Telephone Encounter (Signed)
Amphetamine-Dextroamphetamine refilled per patient request.

## 2021-10-24 NOTE — Telephone Encounter (Signed)
Amphetamine-Dextroamphetamine refilled per patient request.  ?

## 2021-10-25 ENCOUNTER — Telehealth: Payer: Self-pay | Admitting: Physical Therapy

## 2021-10-25 ENCOUNTER — Encounter: Admitting: Physical Therapy

## 2021-10-25 ENCOUNTER — Telehealth: Payer: Self-pay

## 2021-10-25 NOTE — Telephone Encounter (Signed)
?  Follow up Call- ? ? ?  10/21/2021  ?  9:49 AM  ?Call back number  ?Post procedure Call Back phone  # [phone]  ?Permission to leave phone message Yes  ?  ? ?Patient questions: ? ?Do you have a fever, pain , or abdominal swelling? No. ?Pain Score  0 * ? ?Have you tolerated food without any problems? Yes.   ? ?Have you been able to return to your normal activities? Yes.   ? ?Do you have any questions about your discharge instructions: ?Diet   No. ?Medications  No. ?Follow up visit  No. ? ?Do you have questions or concerns about your Care? No. ? ?Actions: ?* If pain score is 4 or above: ?No action needed, pain <4. ? ? ?

## 2021-10-25 NOTE — Telephone Encounter (Signed)
Pt did not show for PT appointment today. They were contacted and informed of this via voicemail. They were provided the date and time of their next appointment on voicemail. They were instructed to call us to let us know if they cannot make their appointment. ? ?Sophia Long, PT, DPT ?10/25/21 1:02 PM ? ?

## 2021-10-26 ENCOUNTER — Telehealth: Payer: Self-pay | Admitting: Physical Therapy

## 2021-10-26 ENCOUNTER — Encounter: Admitting: Physical Therapy

## 2021-10-26 NOTE — Telephone Encounter (Signed)
Pt did not show for PT appointment today. They were contacted and informed of this via voicemail. They were provided the date and time of their next appointment on voicemail. They were instructed to call us to let us know if they cannot make their appointment. ? ?Elsie Ra, PT, DPT ?10/26/21 12:05 PM ? ?

## 2021-10-27 ENCOUNTER — Encounter: Payer: Self-pay | Admitting: Orthopaedic Surgery

## 2021-10-28 ENCOUNTER — Encounter: Payer: Self-pay | Admitting: Rehabilitative and Restorative Service Providers"

## 2021-10-28 ENCOUNTER — Ambulatory Visit (INDEPENDENT_AMBULATORY_CARE_PROVIDER_SITE_OTHER): Admitting: Rehabilitative and Restorative Service Providers"

## 2021-10-28 ENCOUNTER — Telehealth: Payer: Self-pay | Admitting: Orthopaedic Surgery

## 2021-10-28 ENCOUNTER — Other Ambulatory Visit: Payer: Self-pay | Admitting: Physician Assistant

## 2021-10-28 DIAGNOSIS — M25561 Pain in right knee: Secondary | ICD-10-CM

## 2021-10-28 DIAGNOSIS — M25661 Stiffness of right knee, not elsewhere classified: Secondary | ICD-10-CM | POA: Diagnosis not present

## 2021-10-28 DIAGNOSIS — M6281 Muscle weakness (generalized): Secondary | ICD-10-CM | POA: Diagnosis not present

## 2021-10-28 DIAGNOSIS — R2689 Other abnormalities of gait and mobility: Secondary | ICD-10-CM | POA: Diagnosis not present

## 2021-10-28 DIAGNOSIS — Z86018 Personal history of other benign neoplasm: Secondary | ICD-10-CM | POA: Insufficient documentation

## 2021-10-28 DIAGNOSIS — R600 Localized edema: Secondary | ICD-10-CM

## 2021-10-28 DIAGNOSIS — H9072 Mixed conductive and sensorineural hearing loss, unilateral, left ear, with unrestricted hearing on the contralateral side: Secondary | ICD-10-CM | POA: Insufficient documentation

## 2021-10-28 MED ORDER — HYDROCODONE-ACETAMINOPHEN 5-325 MG PO TABS: 1.0000 | ORAL_TABLET | Freq: Three times a day (TID) | ORAL | 0 refills | Status: DC | PRN

## 2021-10-28 NOTE — Therapy (Signed)
?OUTPATIENT PHYSICAL THERAPY TREATMENT NOTE/PROGRESS NOTE ? ? ?Patient Name: Sophia Long ?MRN: [id number] ?DOB:1981-01-25, 41 y.o., female ?Today's Date: 10/28/2021 ? ?Progress Note ?Reporting Period 10/11/2021 to 10/28/2021 ? ?See note below for Objective Data and Assessment of Progress/Goals.  ? ?  ?END OF SESSION:  ? PT End of Session - 10/28/21 1156   ? ? Visit Number 6   ? Number of Visits 15   ? Date for PT Re-Evaluation 01/03/22   ? Authorization Type [employer]   ? PT Start Time 1149   ? PT Stop Time 1240   ? PT Time Calculation (min) 51 min   ? Activity Tolerance Patient tolerated treatment well;Patient limited by pain   ? Behavior During Therapy [facility] for tasks assessed/performed   ? ?  ?  ? ?  ? ? ? ? ? ? ?Past Medical History:  ?Diagnosis Date  ? 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 ADHD (attention deficit hyperactivity disorder)   ? Asthma   ? Crohn's disease ([place]) 2000  ? DVT (deep venous thrombosis) ([place]) 2012  ? History of hiatal hernia 2018  ? Pulmonary embolism ([place]) 2012  ? ?Past Surgical History:  ?Procedure Laterality Date  ? ABDOMINAL HYSTERECTOMY  2009  ? partial  ? CESAREAN SECTION  2002  ? x 4 (2002, 2004, 2005, 2008)  ? CHOLECYSTECTOMY  2000  ? GASTRIC BYPASS  2019  ? HARDWARE REMOVAL Left 2015  ? knee  ? KNEE SURGERY Right 2012  ? KNEE SURGERY Left 2013  ? [place] RESECTION  2018  ? TENDON RECONSTRUCTION Right 2015  ? TOTAL KNEE ARTHROPLASTY Right 09/19/2021  ? Procedure: RIGHT TOTAL KNEE ARTHROPLASTY;  Surgeon: Leandrew Koyanagi, MD;  Location: [facility];  Service: Orthopedics;  Laterality: Right;  ? TUBAL LIGATION  2005  ? ?Patient Active Problem List  ? Diagnosis Date Noted  ? 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 Primary osteoarthritis of right knee 08/25/2021  ? Primary osteoarthritis of left knee 08/25/2021  ? 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 Asthma 08/15/2021  ? Hearing loss of left ear 08/15/2021   ? Encounter to establish care 08/15/2021  ? History of COVID-19 08/15/2021  ? Acute cough 08/15/2021  ? ? ?REFERRING PROVIDER: Leandrew Koyanagi, MD ?  ?REFERRING DIAG: L45.625 (ICD-10-CM) - Hx of total knee replacement, right ? ?ONSET DATE: 09/19/21 ? ?THERAPY DIAG:  ?Other abnormalities of gait and mobility ? ?Stiffness of right knee, not elsewhere classified ? ?Acute pain of right knee ? ?Muscle weakness (generalized) ? ?Localized edema ? ?PERTINENT HISTORY: Rt TKA 09/19/21, DVT and PE 2012, 2 other previous knee surgeries patella realighmenta nd tendon reconstruction ? ?PRECAUTIONS: None ? ?SUBJECTIVE: Sophia Long had a family emergency and had to miss her last 2 appointments.  She is happy to be back on the schedule.  She is only using 1 Percocet per day and is largely managing her pain with a muscle relaxer and ice. ? ?PAIN:  ?Are you having pain? Yes:  ?NPRS scale:  2-7/10 this week ?Pain location: Rt anterior knee ?Pain description: dull and achy but sharp if flexed too much ?Aggravating factors: static positioning, end range complaints.  ?Relieving factors: moving, ice, meds ? ? ?OBJECTIVE:  ?  ?PATIENT SURVEYS:  ?10/28/2021: FOTO 44 (Goal 59) ?10/11/2021: FOTO 35% functional ?  ?MUSCLE LENGTH: ?10/11/2021:  Tight H.S, and quads on Rt ?  ?PALPATION: ?10/11/2021:  TTP  and pain anterior knee ? ?LE ROM: ?  ?AROM/PROM Right ?10/11/2021 Right ?10/13/2021  Right ?10/20/21 Right 10/28/2021  ?Hip flexion        ?Hip extension        ?Hip abduction        ?Hip adduction        ?Hip internal rotation        ?Hip external rotation        ?Knee flexion 75/80 Supine heel slide AROM: 74 ?  Supine AA: ?83 Active 85  ?Knee extension 1/0  Seated AROM LAQ : -30 c pain  0 (Full extension)  ?Ankle dorsiflexion        ?Ankle plantarflexion        ?Ankle inversion        ?Ankle eversion        ? (Blank rows = not tested) ?  ?LE MMT: ?  ?MMT in sitting Right ?10/11/2021  ?Hip flexion 3/5  ?Hip extension    ?Hip abduction 3/5  ?Hip adduction    ?Hip  internal rotation    ?Hip external rotation    ?Knee flexion 3/5  ?Knee extension 2/5  ?Ankle dorsiflexion    ?Ankle plantarflexion    ?Ankle inversion    ?Ankle eversion    ? (Blank rows = not tested) ?  ?FUNCTIONAL TESTS:  ?  ?  ?GAIT: ?10/11/2021:   Distance walked: 75 ?Assistive device utilized: Environmental consultant - 2 wheeled ?Level of assistance: supervision ?Comments: RW too short but is at its tallest level it can be adjusted, she has decreased hip/knee flexion and weight shift to Rt ?  ?  ?TODAY'S TREATMENT: ?10/28/2021 ?Therapeutic Exercise: ?Recumbent bike Seat 5 for 8 minutes AAROM ? ?T[place]gate knee flexion 2 minutes ?Knee flexion AAROM (L pushes R into flexion) 10X 10 seconds ?Quadriceps sets 2 sets of 10 for  5 seconds ? ?Functional Activities: ?Leg Press for sit to stand and stairs, double leg push into extension and flexion (5 seconds hold each position) 15X with 75# double leg and 10X with 37# R leg only.  Slow eccentrics. ? ?Vasopneumatic 10 minutes R knee 10 minutes post-exercises High Pressure 34* ? ? ?10/20/21 ?Therex: ?     Aerobic: ?Recumbent bike x 8 min; seat 5; partial revolutions ?    Sitting: ?Tailgate flexion x 2 min ?AA Rt knee flexion 10 x 10 sec hold (LLE providing overpressure) ? ?    Supine: ?Holding hip in 90 deg flexion: AA knee extension with relax to flexion - not formally measured but able to get to near 90 deg (PT supporting lower leg) ?AA heel slides x 10 reps on Rt ?Quad sets performed bil x 10 reps ?Gait: ?Amb 12' with SPC and HHA with min A and cues for sequening ? ?Manual: ?Rt knee flexion in supine and sitting, use of percussive device on quad to help with decreasing guarding ?Modalities: ?Vaso x 10 min; Rt knee; medium pressure, 34 deg ? ? ?10/19/2021 ?Therapeutic Exercise: ?Recumbent bike Seat 5 for 8 minutes AAROM ? ?Tailgate knee flexion 2 minutes ?Knee flexion AAROM (L pushes R into flexion) 10X 10 seconds ?Quadriceps sets 2 sets of 10 for  5 seconds ?Seated straight leg raises 3 sets  of 5 for 3 seconds (needs PT assist for the lift) ? ?Vasopneumatic 10 minutes R knee 10 minutes post-exercises High Pressure 34* ? ?  ?PATIENT EDUCATION:  ?10/11/2021 ?Education details: HEP,POC, ?Person educated: Patient and Spouse ?Education method: Explanation, Demonstration, Verbal cues, and  Handouts ?Education comprehension: verbalized understanding, returned demonstration, and needs further education ?  ?  ?HOME EXERCISE PROGRAM: ?10/11/2021 ?Access Code: [email or web address] ?URL: [email or web address] ?Date: 10/11/2021 ?Prepared by: Elsie Ra ?  ?Exercises ?- Supine Heel Slide with Strap  - 2 x daily - 6 x weekly - 1-2 sets - 10 reps - 5 hold ?- Supine Quadricep Sets  - 2 x daily - 6 x weekly - 1-2 sets - 10 reps - 5 sec hold ?- Seated Long Arc Quad  - 2 x daily - 6 x weekly - 2-3 sets - 10 reps ?- Seated Knee Flexion Stretch  - 2 x daily - 6 x weekly - 1-2 sets - 10 reps - 5 sec hold ?- Seated Hamstring Stretch  - 2 x daily - 6 x weekly - 1 sets - 2-3 reps - 30 hold ?  ?            ?ASSESSMENT: ?  ?CLINICAL IMPRESSION: Joua has great extension AROM.  Flexion AROM and quadriceps strengthening are the emphasis of her current home and clinic program.  Balance and functional activities will be progressed as appropriate. ? ?  ?OBJECTIVE IMPAIRMENTS: decreased activity tolerance, difficulty walking, decreased balance, decreased endurance, decreased mobility, decreased ROM, decreased strength, impaired flexibility, impaired sLE use, postural dysfunction, and pain. ?  ?ACTIVITY LIMITATIONS: bending, lifting, carry, locomotion, cleaning, community activity, driving, and or occupation ?  ?PERSONAL FACTORS: S/P RIGHT TKA-09/19/2021, DVT and PE 2012, 2 other previous knee surgeries patella realignment and tendon reconstruction are also affecting patient's functional outcome. ?  ?REHAB POTENTIAL: good ?  ?CLINICAL DECISION MAKING: Stable/uncomplicated ?  ?EVALUATION COMPLEXITY: Low ?  ?GOALS: ?Short term PT Goals  Target date: 11/08/2021 ?Pt will be I and compliant with HEP. ?Baseline:  ?Goal status: Met 10/19/2021 ?Pt will decrease pain by 25% overall ?Baseline: ?Goal status: on going 10/28/2021 ?  ?Long term PT goals T

## 2021-10-28 NOTE — Telephone Encounter (Signed)
Pt called requesting a refill of oxycodone. Please send to pharmacy on file. Pt phone number is [phone]. ?

## 2021-10-28 NOTE — Telephone Encounter (Signed)
Sent msg through [employer] ?

## 2021-10-28 NOTE — Telephone Encounter (Signed)
We need to wean to norco.  Sent in and decreased freqency

## 2021-10-31 ENCOUNTER — Encounter: Payer: Self-pay | Admitting: Physical Therapy

## 2021-10-31 ENCOUNTER — Ambulatory Visit (INDEPENDENT_AMBULATORY_CARE_PROVIDER_SITE_OTHER): Admitting: Physical Therapy

## 2021-10-31 DIAGNOSIS — M25561 Pain in right knee: Secondary | ICD-10-CM

## 2021-10-31 DIAGNOSIS — R2689 Other abnormalities of gait and mobility: Secondary | ICD-10-CM

## 2021-10-31 DIAGNOSIS — M6281 Muscle weakness (generalized): Secondary | ICD-10-CM

## 2021-10-31 DIAGNOSIS — R600 Localized edema: Secondary | ICD-10-CM

## 2021-10-31 DIAGNOSIS — M25661 Stiffness of right knee, not elsewhere classified: Secondary | ICD-10-CM

## 2021-10-31 NOTE — Therapy (Signed)
?OUTPATIENT PHYSICAL THERAPY TREATMENT NOTE ? ? ?Patient Name: Laketta Soderberg ?MRN: [id number] ?DOB:Nov 16, 1980, 41 y.o., female ?Today's Date: 10/31/2021 ? ? ? ?  ?END OF SESSION:  ? PT End of Session - 10/31/21 1402   ? ? Visit Number 7   ? Number of Visits 15   ? Date for PT Re-Evaluation 01/03/22   ? Authorization Type [employer]   ? PT Start Time 7564   ? PT Stop Time 1430   ? PT Time Calculation (min) 38 min   ? Activity Tolerance Patient tolerated treatment well;Patient limited by pain   ? Behavior During Therapy [facility] for tasks assessed/performed   ? ?  ?  ? ?  ? ? ? ? ? ? ?Past Medical History:  ?Diagnosis Date  ? Acoustic neuroma [facility]) 2013  ? ADHD (attention deficit hyperactivity disorder)   ? Asthma   ? Crohn's disease ([place]) 2000  ? DVT (deep venous thrombosis) ([place]) 2012  ? History of hiatal hernia 2018  ? Pulmonary embolism ([place]) 2012  ? ?Past Surgical History:  ?Procedure Laterality Date  ? ABDOMINAL HYSTERECTOMY  2009  ? partial  ? CESAREAN SECTION  2002  ? x 4 (2002, 2004, 2005, 2008)  ? CHOLECYSTECTOMY  2000  ? GASTRIC BYPASS  2019  ? HARDWARE REMOVAL Left 2015  ? knee  ? KNEE SURGERY Right 2012  ? KNEE SURGERY Left 2013  ? [place] RESECTION  2018  ? TENDON RECONSTRUCTION Right 2015  ? TOTAL KNEE ARTHROPLASTY Right 09/19/2021  ? Procedure: RIGHT TOTAL KNEE ARTHROPLASTY;  Surgeon: Leandrew Koyanagi, MD;  Location: [place];  Service: Orthopedics;  Laterality: Right;  ? TUBAL LIGATION  2005  ? ?Patient Active Problem List  ? Diagnosis Date Noted  ? 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 Primary osteoarthritis of right knee 08/25/2021  ? Primary osteoarthritis of left knee 08/25/2021  ? 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 Asthma 08/15/2021  ? Hearing loss of left ear 08/15/2021  ? Encounter to establish care 08/15/2021  ? History of COVID-19 08/15/2021  ? Acute cough 08/15/2021  ? ? ?REFERRING PROVIDER: Leandrew Koyanagi, MD ?  ?REFERRING DIAG: P32.951 (ICD-10-CM) - Hx of total knee replacement, right ? ?ONSET DATE: 09/19/21 ? ?THERAPY DIAG:  ?Other abnormalities of gait and mobility ? ?Stiffness of right knee, not elsewhere classified ? ?Localized edema ? ?Acute pain of right knee ? ?Muscle weakness (generalized) ? ?PERTINENT HISTORY: Rt TKA 09/19/21, DVT and PE 2012, 2 other previous knee surgeries patella realighmenta nd tendon reconstruction ? ?PRECAUTIONS: None ? ?SUBJECTIVE: She relays she has been working hard on the bend at home.  ? ?PAIN:  ?Are you having pain? Yes:  ?NPRS scale:  2-7/10 this week ?Pain location: Rt anterior knee ?Pain description: dull and achy but sharp if flexed too much ?Aggravating factors: static positioning, end range complaints.  ?Relieving factors: moving, ice, meds ? ? ?OBJECTIVE:  ?  ?PATIENT SURVEYS:  ?10/28/2021: FOTO 44 (Goal 59) ?10/11/2021: FOTO 35% functional ?  ?MUSCLE LENGTH: ?10/11/2021:  Tight H.S, and quads on Rt ?  ?PALPATION: ?10/11/2021:  TTP and pain anterior knee ? ?LE ROM: ?  ?AROM/PROM Right ?10/11/2021 Right ?10/13/2021  Right ?10/20/21 Right 10/28/2021 Right ?10/31/21  ?Hip flexion         ?Hip extension         ?Hip abduction         ?  Hip adduction         ?Hip internal rotation         ?Hip external rotation         ?Knee flexion 75/80 Supine heel slide AROM: 74 ?  Supine AA: ?83 Active 85 Active 85 ?Passive 95  ?Knee extension 1/0  Seated AROM LAQ : -30 c pain  0 (Full extension) 0  ?Ankle dorsiflexion         ?Ankle plantarflexion         ?Ankle inversion         ?Ankle eversion         ? (Blank rows = not tested) ?  ?LE MMT: ?  ?MMT in sitting Right ?10/11/2021 Right  ?Hip flexion 3/5   ?Hip extension     ?Hip abduction 3/5   ?Hip adduction     ?Hip internal rotation     ?Hip external rotation     ?Knee flexion 3/5 4/5  ?Knee extension 2/5 3/5  ?Ankle dorsiflexion     ?Ankle plantarflexion     ?Ankle inversion     ?Ankle eversion     ? (Blank rows = not tested) ?  ?FUNCTIONAL TESTS:   ?  ?  ?GAIT: ?10/11/2021:   Distance walked: 75 ?Assistive device utilized: Environmental consultant - 2 wheeled ?Level of assistance: supervision ?Comments: RW too short but is at its tallest level it can be adjusted, she has decreased hip/knee flexion and weight shift to Rt ?  ?  ?TODAY'S TREATMENT: ?10/31/2021 ?Therapeutic Exercise: ?Nu step L6 X 10 min ?Knee flexion AAROM (L pushes R into flexion) 10 seconds X 3 minutes ?Seated LAQ 2X10 on Rt ?Supine quad set X 10 ?Supine SLR with mod assist X 10 ?Supine SAQ X 10 ? ?Functional Activities: ?Leg Press for sit to stand and stairs, double leg push into extension and flexion (5 seconds hold each position) 15X with 75# double leg and 5X with 37# R leg only and 5 X 25# Rt leg only.  Slow eccentrics. ?Step ups onto 4 inch step Rt leg X 5 with bilat UE support ? ?Manual therapy ?Rt knee PROM into flexion and flexion mobs grade 2 ? ?10/28/2021 ?Therapeutic Exercise: ?Recumbent bike Seat 5 for 8 minutes AAROM ? ?Tailgate knee flexion 2 minutes ?Knee flexion AAROM (L pushes R into flexion) 10X 10 seconds ?Quadriceps sets 2 sets of 10 for  5 seconds ? ?Functional Activities: ?Leg Press for sit to stand and stairs, double leg push into extension and flexion (5 seconds hold each position) 15X with 75# double leg and 10X with 37# R leg only.  Slow eccentrics. ? ?Vasopneumatic 10 minutes R knee 10 minutes post-exercises High Pressure 34* ? ? ?10/20/21 ?Therex: ?     Aerobic: ?Recumbent bike x 8 min; seat 5; partial revolutions ?    Sitting: ?Tailgate flexion x 2 min ?AA Rt knee flexion 10 x 10 sec hold (LLE providing overpressure) ? ?    Supine: ?Holding hip in 90 deg flexion: AA knee extension with relax to flexion - not formally measured but able to get to near 90 deg (PT supporting lower leg) ?AA heel slides x 10 reps on Rt ?Quad sets performed bil x 10 reps ?Gait: ?Amb 91' with SPC and HHA with min A and cues for sequening ? ?Manual: ?Rt knee flexion in supine and sitting, use of percussive  device on quad to help with decreasing guarding ?Modalities: ?Vaso x 10 min; Rt knee; medium  pressure, 34 deg ? ? ?10/19/2021 ?Therapeutic Exercise: ?Recumbent bike Seat 5 for 8 minutes AAROM ? ?Tailgate knee flexion 2 minutes ?Knee flexion AAROM (L pushes R into flexion) 10X 10 seconds ?Quadriceps sets 2 sets of 10 for  5 seconds ?Seated straight leg raises 3 sets of 5 for 3 seconds (needs PT assist for the lift) ? ?Vasopneumatic 10 minutes R knee 10 minutes post-exercises High Pressure 34* ? ?  ?PATIENT EDUCATION:  ?10/11/2021 ?Education details: HEP,POC, ?Person educated: Patient and Spouse ?Education method: Explanation, Demonstration, Verbal cues, and Handouts ?Education comprehension: verbalized understanding, returned demonstration, and needs further education ?  ?  ?HOME EXERCISE PROGRAM: ?10/11/2021 ?Access Code: [email or web address] ?URL: [email or web address] ?Date: 10/11/2021 ?Prepared by: Elsie Ra ?  ?Exercises ?- Supine Heel Slide with Strap  - 2 x daily - 6 x weekly - 1-2 sets - 10 reps - 5 hold ?- Supine Quadricep Sets  - 2 x daily - 6 x weekly - 1-2 sets - 10 reps - 5 sec hold ?- Seated Long Arc Quad  - 2 x daily - 6 x weekly - 2-3 sets - 10 reps ?- Seated Knee Flexion Stretch  - 2 x daily - 6 x weekly - 1-2 sets - 10 reps - 5 sec hold ?- Seated Hamstring Stretch  - 2 x daily - 6 x weekly - 1 sets - 2-3 reps - 30 hold ?  ?            ?ASSESSMENT: ?  ?CLINICAL IMPRESSION: She has good extension ROM but is limted by flexion ROM and quad weakness. She still requires RW for ambulation at this time. She is making progress with PT however and PT recommends to continue with current PT plan.  ? ?  ?OBJECTIVE IMPAIRMENTS: decreased activity tolerance, difficulty walking, decreased balance, decreased endurance, decreased mobility, decreased ROM, decreased strength, impaired flexibility, impaired sLE use, postural dysfunction, and pain. ?  ?ACTIVITY LIMITATIONS: bending, lifting, carry, locomotion, cleaning,  community activity, driving, and or occupation ?  ?PERSONAL FACTORS: S/P RIGHT TKA-09/19/2021, DVT and PE 2012, 2 other previous knee surgeries patella realignment and tendon reconstruction are also affecting

## 2021-11-01 ENCOUNTER — Ambulatory Visit (INDEPENDENT_AMBULATORY_CARE_PROVIDER_SITE_OTHER)

## 2021-11-01 ENCOUNTER — Encounter: Payer: Self-pay | Admitting: Orthopaedic Surgery

## 2021-11-01 ENCOUNTER — Ambulatory Visit (INDEPENDENT_AMBULATORY_CARE_PROVIDER_SITE_OTHER): Admitting: Orthopaedic Surgery

## 2021-11-01 DIAGNOSIS — Z96651 Presence of right artificial knee joint: Secondary | ICD-10-CM

## 2021-11-01 MED ORDER — OXYCODONE-ACETAMINOPHEN 5-325 MG PO TABS: 1.0000 | ORAL_TABLET | Freq: Every day | ORAL | 0 refills | Status: DC | PRN

## 2021-11-01 NOTE — Progress Notes (Signed)
? ?Post-Op Visit Note ?  ?Patient: Sophia Long           ?Date of Birth: 10/19/1980           ?MRN: [id number] ?Visit Date: 11/01/2021 ?PCP: Dorna Mai, MD ? ? ?Assessment & Plan: ? ?Chief Complaint:  ?Chief Complaint  ?Patient presents with  ? Right Knee - Follow-up  ?  Right total knee arthroplasty 09/19/2021  ? ?Visit Diagnoses:  ?1. Hx of total knee replacement, right   ? ? ?Plan: Sophia Long is 6 weeks status post right total knee replacement on 09/19/2021.  She is doing physical therapy 3 times a week at her office.  Her last visit she was able to get to 95 degrees of flexion.  Her main complaint is the swelling. ? ?Examination of right knee shows a fully healed surgical scar.  Range of motion is 0 to 80 degrees with a soft endpoint.  Moderate swelling throughout the knee and extremity.  No neurovascular compromise. ? ?Sophia Long is making steady progress.  I think her swelling is biggest deterrent to the range of motion.  Does not feel that she has adhesions based on how the knee feels on exam.  She will continue to work on the swelling and physical therapy.  Percocet refilled today.  Dental prophylaxis reinforced.  Recheck in 6 weeks with two-view x-rays of the right knee. ? ?Follow-Up Instructions: Return in about 6 weeks (around 12/13/2021).  ? ?Orders:  ?Orders Placed This Encounter  ?Procedures  ? XR Knee 1-2 Views Right  ? ?Meds ordered this encounter  ?Medications  ? oxyCODONE-acetaminophen (PERCOCET) 5-325 MG tablet  ?  Sig: Take 1-2 tablets by mouth daily as needed.  ?  Dispense:  30 tablet  ?  Refill:  0  ? ? ?Imaging: ?XR Knee 1-2 Views Right ? ?Result Date: 11/01/2021 ?Stable total knee replacement in good alignment   ? ?PMFS History: ?Patient Active Problem List  ? Diagnosis Date Noted  ? 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 Primary osteoarthritis of right knee 08/25/2021  ? Primary osteoarthritis of left knee 08/25/2021  ? Regional enteritis of large intestine ([place]) 08/15/2021  ? Other  disorder of impulse control 08/15/2021  ? Asthma 08/15/2021  ? Hearing loss of left ear 08/15/2021  ? Encounter to establish care 08/15/2021  ? History of COVID-19 08/15/2021  ? Acute cough 08/15/2021  ? ?Past Medical History:  ?Diagnosis Date  ? Acoustic neuroma [facility]) 2013  ? ADHD (attention deficit hyperactivity disorder)   ? Asthma   ? Crohn's disease ([place]) 2000  ? DVT (deep venous thrombosis) ([place]) 2012  ? History of hiatal hernia 2018  ? Pulmonary embolism ([place]) 2012  ?  ?Family History  ?Problem Relation Age of Onset  ? Hypertension Mother   ? Diabetes Father   ? Colon cancer Maternal Uncle   ? Stomach cancer Maternal Uncle   ?  ?Past Surgical History:  ?Procedure Laterality Date  ? ABDOMINAL HYSTERECTOMY  2009  ? partial  ? CESAREAN SECTION  2002  ? x 4 (2002, 2004, 2005, 2008)  ? CHOLECYSTECTOMY  2000  ? GASTRIC BYPASS  2019  ? HARDWARE REMOVAL Left 2015  ? knee  ? KNEE SURGERY Right 2012  ? KNEE SURGERY Left 2013  ? [place] RESECTION  2018  ? TENDON RECONSTRUCTION Right 2015  ? TOTAL KNEE ARTHROPLASTY Right 09/19/2021  ? Procedure: RIGHT TOTAL KNEE ARTHROPLASTY;  Surgeon: Leandrew Koyanagi, MD;  Location:  [place] [facility];  Service: Orthopedics;  Laterality: Right;  ? TUBAL LIGATION  2005  ? ?Social History  ? ?Occupational History  ? Not on file  ?Tobacco Use  ? Smoking status: Every Day  ?  Packs/day: 0.20  ?  Types: Cigarettes  ? Smokeless tobacco: Never  ?Vaping Use  ? Vaping Use: Never used  ?Substance and Sexual Activity  ? Alcohol use: Not Currently  ? Drug use: Never  ? Sexual activity: Yes  ? ? ? ?

## 2021-11-02 ENCOUNTER — Encounter: Payer: Self-pay | Admitting: Physical Therapy

## 2021-11-02 ENCOUNTER — Encounter: Admitting: Physical Therapy

## 2021-11-03 ENCOUNTER — Other Ambulatory Visit: Payer: Self-pay | Admitting: Physician Assistant

## 2021-11-04 ENCOUNTER — Encounter: Payer: Self-pay | Admitting: Physical Therapy

## 2021-11-04 ENCOUNTER — Ambulatory Visit (INDEPENDENT_AMBULATORY_CARE_PROVIDER_SITE_OTHER): Admitting: Physical Therapy

## 2021-11-04 DIAGNOSIS — M25561 Pain in right knee: Secondary | ICD-10-CM

## 2021-11-04 DIAGNOSIS — R2689 Other abnormalities of gait and mobility: Secondary | ICD-10-CM | POA: Diagnosis not present

## 2021-11-04 DIAGNOSIS — M25661 Stiffness of right knee, not elsewhere classified: Secondary | ICD-10-CM

## 2021-11-04 DIAGNOSIS — R600 Localized edema: Secondary | ICD-10-CM | POA: Diagnosis not present

## 2021-11-04 DIAGNOSIS — M6281 Muscle weakness (generalized): Secondary | ICD-10-CM

## 2021-11-04 NOTE — Therapy (Signed)
OUTPATIENT PHYSICAL THERAPY TREATMENT NOTE   Patient Name: Sophia Long MRN: [id number] DOB:1980/11/20, 41 y.o., female Today's Date: 11/04/2021      END OF SESSION:   PT End of Session - 11/04/21 1015     Visit Number 8    Number of Visits 15    Date for PT Re-Evaluation 01/03/22    Authorization Type T[employer]    PT Start Time 1014    PT Stop Time 1059    PT Time Calculation (min) 45 min    Activity Tolerance Patient tolerated treatment well;Patient limited by pain    Behavior During Therapy [facility] for tasks assessed/performed                 Past Medical History:  Diagnosis Date   Acoustic neuroma ([place]) 2013   ADHD (attention deficit hyperactivity disorder)    Asthma    Crohn's disease ([place]) 2000   DVT (deep venous thrombosis) ([place]) 2012   History of hiatal hernia 2018   Pulmonary embolism ([place]) 2012   Past Surgical History:  Procedure Laterality Date   ABDOMINAL HYSTERECTOMY  2009   partial   CESAREAN SECTION  2002   x 4 (2002, 2004, 2005, 2008)   CHOLECYSTECTOMY  2000   GASTRIC BYPASS  2019   HARDWARE REMOVAL Left 2015   knee   KNEE SURGERY Right 2012   KNEE SURGERY Left 2013   LAPAROSCOPIC GASTRIC SLEEVE RESECTION  2018   TENDON RECONSTRUCTION Right 2015   TOTAL KNEE ARTHROPLASTY Right 09/19/2021   Procedure: RIGHT TOTAL KNEE ARTHROPLASTY;  Surgeon: Leandrew Koyanagi, MD;  Location: [facility];  Service: Orthopedics;  Laterality: Right;   TUBAL LIGATION  2005   Patient Active Problem List   Diagnosis Date Noted   Status post total right knee replacement 09/19/2021   Primary osteoarthritis of right knee 08/25/2021   Primary osteoarthritis of left knee 08/25/2021   Regional enteritis of large intestine ([place]) 08/15/2021   Other disorder of impulse control 08/15/2021   Asthma 08/15/2021   Hearing loss of left ear 08/15/2021   Encounter to establish care 08/15/2021   History of COVID-19 08/15/2021   Acute cough 08/15/2021    REFERRING PROVIDER: Leandrew Koyanagi, MD   REFERRING DIAG: [phone] (ICD-10-CM) - Hx of total knee replacement, right  ONSET DATE: 09/19/21  THERAPY DIAG:  Other abnormalities of gait and mobility  Stiffness of right knee, not elsewhere classified  Localized edema  Acute pain of right knee  Muscle weakness (generalized)  PERTINENT HISTORY: Rt TKA 09/19/21, DVT and PE 2012, 2 other previous knee surgeries patella realighmenta nd tendon reconstruction  PRECAUTIONS: None  SUBJECTIVE: She relays she has cramp In her calf that is bothering her, she feels stiff in her knee.   PAIN:  Are you having pain? Yes:  NPRS scale:  2-7/10 this week Pain location: Rt anterior knee Pain description: dull and achy but sharp if flexed too much Aggravating factors: static positioning, end range complaints.  Relieving factors: moving, ice, meds   OBJECTIVE:    PATIENT SURVEYS:  10/28/2021: FOTO 44 (Goal 59) 10/11/2021: FOTO 35% functional   MUSCLE LENGTH: 10/11/2021:  Tight H.S, and quads on Rt   PALPATION: 10/11/2021:  TTP and pain anterior knee  LE ROM:   AROM/PROM Right 10/11/2021 Right 10/13/2021  Right 10/20/21 Right 10/28/2021 Right 10/31/21  Hip flexion         Hip extension         Hip abduction  Hip adduction         Hip internal rotation         Hip external rotation         Knee flexion 75/80 Supine heel slide AROM: 74   Supine AA: 83 Active 85 Active 85 Passive 95  Knee extension 1/0  Seated AROM LAQ : -30 c pain  0 (Full extension) 0  Ankle dorsiflexion         Ankle plantarflexion         Ankle inversion         Ankle eversion          (Blank rows = not tested)   LE MMT:   MMT in sitting Right 10/11/2021 Right  Hip flexion 3/5   Hip extension     Hip abduction 3/5   Hip adduction     Hip internal rotation     Hip external rotation     Knee flexion 3/5 4/5  Knee extension 2/5 3/5  Ankle dorsiflexion     Ankle plantarflexion     Ankle inversion     Ankle eversion      (Blank rows = not  tested)   FUNCTIONAL TESTS:      GAIT: 10/11/2021:   Distance walked: 75 Assistive device utilized: Environmental consultant - 2 wheeled Level of assistance: supervision Comments: RW too short but is at its tallest level it can be adjusted, she has decreased hip/knee flexion and weight shift to Rt     TODAY'S TREATMENT: 11/04/2021 Therapeutic Exercise: Leg Press stretching at first with 12# and seat lock reomoved for her to move into max knee flexion ROM (5 seconds hold each position) 15X . Then DL leg press 75# X15 and R leg only 25# X 15. Slow eccentrics. Nu step L6 X 5  min Knee flexion AAROM (L pushes R into flexion) 10 seconds X 10 minutes Seated LAQ 2X10 on Rt with 1# Seated knee flexion stretch LLLD stretching blue band around ankle attached to 30# KB 5 min Supine quad set X 15 holding 5 sec  Manual therapy Rt knee PROM into flexion and flexion mobs grade 2 in both sitting and supine     PATIENT EDUCATION:  10/11/2021 Education details: HEP,POC, Person educated: Patient and Spouse Education method: Explanation, Media planner, Verbal cues, and Handouts Education comprehension: verbalized understanding, returned demonstration, and needs further education     HOME EXERCISE PROGRAM: 10/11/2021 Access Code: [email or web address] URL: [email or web address] Date: 10/11/2021 Prepared by: Elsie Ra   Exercises - Supine Heel Slide with Strap  - 2 x daily - 6 x weekly - 1-2 sets - 10 reps - 5 hold - Supine Quadricep Sets  - 2 x daily - 6 x weekly - 1-2 sets - 10 reps - 5 sec hold - Seated Long Arc Quad  - 2 x daily - 6 x weekly - 2-3 sets - 10 reps - Seated Knee Flexion Stretch  - 2 x daily - 6 x weekly - 1-2 sets - 10 reps - 5 sec hold - Seated Hamstring Stretch  - 2 x daily - 6 x weekly - 1 sets - 2-3 reps - 30 hold               ASSESSMENT:   CLINICAL IMPRESSION: Progress is slow due to pain, knee stiffness, and quad weakness but she shows good effort with PT and we will continue to  work to improve her deficits as tolerated.   OBJECTIVE IMPAIRMENTS: decreased  activity tolerance, difficulty walking, decreased balance, decreased endurance, decreased mobility, decreased ROM, decreased strength, impaired flexibility, impaired sLE use, postural dysfunction, and pain.   ACTIVITY LIMITATIONS: bending, lifting, carry, locomotion, cleaning, community activity, driving, and or occupation   PERSONAL FACTORS: S/P RIGHT TKA-09/19/2021, DVT and PE 2012, 2 other previous knee surgeries patella realignment and tendon reconstruction are also affecting patient's functional outcome.   REHAB POTENTIAL: good   CLINICAL DECISION MAKING: Stable/uncomplicated   EVALUATION COMPLEXITY: Low   GOALS: Short term PT Goals Target date: 11/08/2021 Pt will be I and compliant with HEP. Baseline:  Goal status: Met 10/19/2021 Pt will decrease pain by 25% overall Baseline: Goal status: on going 10/28/2021   Long term PT goals Target date: 01/03/2022 Pt will improve Rt knee AROM to 0-100 degrees to improve functional mobility Baseline: Goal status: 0 - 85 10/28/2021 Pt will improve  hip/knee strength to at least 5-/5 MMT to improve functional strength Baseline: Goal status: On Going 10/28/2021 Pt will improve FOTO to at least 59% functional to show improved function Baseline: 44 Goal status: On Going 10/28/2021 Pt will reduce pain by overall 50% overall with usual activity Baseline: Goal status: On Going 10/28/2021 Pt will be able to ambulate community distances at least 1000 ft WNL gait pattern without complaints or assistance Baseline: Goal status: Non Going using walker 10/28/2021   PLAN: PT FREQUENCY: 2-3 times per week    PT DURATION: 12 weeks   PLANNED INTERVENTIONS (unless contraindicated): aquatic PT, Canalith repositioning, cryotherapy, Electrical stimulation, Iontophoresis with 4 mg/ml dexamethasome, Moist heat, traction, Ultrasound, gait training, Therapeutic exercise, balance training,  neuromuscular re-education, patient/family education, prosthetic training, manual techniques, passive ROM, dry needling, taping, vasopnuematic device, vestibular, spinal manipulations, joint manipulations   PLAN FOR NEXT SESSION:  consider BFR for quad strength. Emphasis remains flexion AROM, quadriceps strength   Elsie Ra, PT, DPT 11/04/21 10:20 AM

## 2021-11-08 ENCOUNTER — Ambulatory Visit (INDEPENDENT_AMBULATORY_CARE_PROVIDER_SITE_OTHER): Admitting: Physical Therapy

## 2021-11-08 ENCOUNTER — Encounter: Payer: Self-pay | Admitting: Physical Therapy

## 2021-11-08 DIAGNOSIS — M25661 Stiffness of right knee, not elsewhere classified: Secondary | ICD-10-CM

## 2021-11-08 DIAGNOSIS — M25561 Pain in right knee: Secondary | ICD-10-CM | POA: Diagnosis not present

## 2021-11-08 DIAGNOSIS — R600 Localized edema: Secondary | ICD-10-CM | POA: Diagnosis not present

## 2021-11-08 DIAGNOSIS — M6281 Muscle weakness (generalized): Secondary | ICD-10-CM

## 2021-11-08 DIAGNOSIS — R2689 Other abnormalities of gait and mobility: Secondary | ICD-10-CM | POA: Diagnosis not present

## 2021-11-08 NOTE — Therapy (Signed)
OUTPATIENT PHYSICAL THERAPY TREATMENT NOTE   Patient Name: Sophia Long MRN: [id number] DOB:Oct 21, 1980, 41 y.o., female Today's Date: 11/08/2021      END OF SESSION:   PT End of Session - 11/08/21 1357     Visit Number 9    Number of Visits 15    Date for PT Re-Evaluation 01/03/22    Authorization Type [employer]    PT Start Time 1349    PT Stop Time 1430    PT Time Calculation (min) 41 min    Activity Tolerance Patient tolerated treatment well;Patient limited by pain    Behavior During Therapy [facility] for tasks assessed/performed                 Past Medical History:  Diagnosis Date   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   ADHD (attention deficit hyperactivity disorder)    Asthma    Crohn's disease ([place]) 2000   DVT (deep venous thrombosis) ([place]) 2012   History of hiatal hernia 2018   Pulmonary embolism ([place]) 2012   Past Surgical History:  Procedure Laterality Date   ABDOMINAL HYSTERECTOMY  2009   partial   CESAREAN SECTION  2002   x 4 (2002, 2004, 2005, 2008)   CHOLECYSTECTOMY  2000   GASTRIC BYPASS  2019   HARDWARE REMOVAL Left 2015   knee   KNEE SURGERY Right 2012   KNEE SURGERY Left 2013   LAPAROSCOPIC GASTRIC SLEEVE RESECTION  2018   TENDON RECONSTRUCTION Right 2015   TOTAL KNEE ARTHROPLASTY Right 09/19/2021   Procedure: RIGHT TOTAL KNEE ARTHROPLASTY;  Surgeon: Leandrew Koyanagi, MD;  Location: [place];  Service: Orthopedics;  Laterality: Right;   TUBAL LIGATION  2005   Patient Active Problem List   Diagnosis Date Noted   Status post total right knee replacement 09/19/2021   Primary osteoarthritis of right knee 08/25/2021   Primary osteoarthritis of left knee 08/25/2021   Regional enteritis of large intestine ([place]) 08/15/2021   Other disorder of impulse control 08/15/2021   Asthma 08/15/2021   Hearing loss of left ear 08/15/2021   Encounter to establish care 08/15/2021   History of COVID-19 08/15/2021   Acute cough 08/15/2021    REFERRING PROVIDER: Leandrew Koyanagi, MD   REFERRING DIAG: [phone] (ICD-10-CM) - Hx of total knee replacement, right  ONSET DATE: 09/19/21  THERAPY DIAG:  Other abnormalities of gait and mobility  Stiffness of right knee, not elsewhere classified  Localized edema  Acute pain of right knee  Muscle weakness (generalized)  PERTINENT HISTORY: Rt TKA 09/19/21, DVT and PE 2012, 2 other previous knee surgeries patella realighmenta nd tendon reconstruction  PRECAUTIONS: None  SUBJECTIVE: She relays her knee is not hurting as much today  PAIN:  Are you having pain? Yes:  NPRS scale:  2-7/10 this week Pain location: Rt anterior knee Pain description: dull and achy but sharp if flexed too much Aggravating factors: static positioning, end range complaints.  Relieving factors: moving, ice, meds   OBJECTIVE:    PATIENT SURVEYS:  10/28/2021: FOTO 44 (Goal 59) 10/11/2021: FOTO 35% functional   MUSCLE LENGTH: 10/11/2021:  Tight H.S, and quads on Rt   PALPATION: 10/11/2021:  TTP and pain anterior knee  LE ROM:   AROM/PROM Right 10/11/2021 Right 10/13/2021  Right 10/20/21 Right 10/28/2021 Right 10/31/21  Hip flexion         Hip extension         Hip abduction         Hip  adduction         Hip internal rotation         Hip external rotation         Knee flexion 75/80 Supine heel slide AROM: 74   Supine AA: 83 Active 85 Active 85 Passive 95  Knee extension 1/0  Seated AROM LAQ : -30 c pain  0 (Full extension) 0  Ankle dorsiflexion         Ankle plantarflexion         Ankle inversion         Ankle eversion          (Blank rows = not tested)   LE MMT:   MMT in sitting Right 10/11/2021 Right 10/31/21  Hip flexion 3/5   Hip extension     Hip abduction 3/5   Hip adduction     Hip internal rotation     Hip external rotation     Knee flexion 3/5 4/5  Knee extension 2/5 3/5  Ankle dorsiflexion     Ankle plantarflexion     Ankle inversion     Ankle eversion      (Blank rows = not tested)   FUNCTIONAL TESTS:       GAIT: 10/11/2021:   Distance walked: 75 Assistive device utilized: Environmental consultant - 2 wheeled Level of assistance: supervision Comments: RW too short but is at its tallest level it can be adjusted, she has decreased hip/knee flexion and weight shift to Rt     TODAY'S TREATMENT: 11/08/2021 Therapeutic Exercise: Nu step X 10 min L6 holding max flexion ROM for a few seconds each step Leg Press R leg only 25# 2X 15. Slow eccentrics, then DL 75# 2X10 Knee flexion AAROM (L pushes R into flexion) 10 seconds X 3 minutes Seated LAQ 3X10 on Rt with 2# Supine SLR Rt X 5  Gait in // bars with light bilat UE support progressed to one UE support on left 3 round trips, then progressed to ambulating with SPC with quad tip in clinic 50 feet X 2 with CGA.  Manual therapy Rt knee PROM into flexion and flexion mobs grade 2 in sitting     PATIENT EDUCATION:  10/11/2021 Education details: HEP,POC, Person educated: Patient and Spouse Education method: Explanation, Demonstration, Verbal cues, and Handouts Education comprehension: verbalized understanding, returned demonstration, and needs further education     HOME EXERCISE PROGRAM: 10/11/2021 Access Code: [email or web address] URL: [email or web address] Date: 10/11/2021 Prepared by: Elsie Ra   Exercises - Supine Heel Slide with Strap  - 2 x daily - 6 x weekly - 1-2 sets - 10 reps - 5 hold - Supine Quadricep Sets  - 2 x daily - 6 x weekly - 1-2 sets - 10 reps - 5 sec hold - Seated Long Arc Quad  - 2 x daily - 6 x weekly - 2-3 sets - 10 reps - Seated Knee Flexion Stretch  - 2 x daily - 6 x weekly - 1-2 sets - 10 reps - 5 sec hold - Seated Hamstring Stretch  - 2 x daily - 6 x weekly - 1 sets - 2-3 reps - 30 hold               ASSESSMENT:   CLINICAL IMPRESSION: She made good progress from last week and now has the strength to perform SLR in supine. She also was able to progress ambulation from RW to [facility] for short distance with CGA.   OBJECTIVE  IMPAIRMENTS: decreased activity  tolerance, difficulty walking, decreased balance, decreased endurance, decreased mobility, decreased ROM, decreased strength, impaired flexibility, impaired sLE use, postural dysfunction, and pain.   ACTIVITY LIMITATIONS: bending, lifting, carry, locomotion, cleaning, community activity, driving, and or occupation   PERSONAL FACTORS: S/P RIGHT TKA-09/19/2021, DVT and PE 2012, 2 other previous knee surgeries patella realignment and tendon reconstruction are also affecting patient's functional outcome.   REHAB POTENTIAL: good   CLINICAL DECISION MAKING: Stable/uncomplicated   EVALUATION COMPLEXITY: Low   GOALS: Short term PT Goals Target date: 11/08/2021 Pt will be I and compliant with HEP. Baseline:  Goal status: Met 10/19/2021 Pt will decrease pain by 25% overall Baseline: Goal status: on going 10/28/2021   Long term PT goals Target date: 01/03/2022 Pt will improve Rt knee AROM to 0-100 degrees to improve functional mobility Baseline: Goal status: 0 - 85 10/28/2021 Pt will improve  hip/knee strength to at least 5-/5 MMT to improve functional strength Baseline: Goal status: On Going 10/28/2021 Pt will improve FOTO to at least 59% functional to show improved function Baseline: 44 Goal status: On Going 10/28/2021 Pt will reduce pain by overall 50% overall with usual activity Baseline: Goal status: On Going 10/28/2021 Pt will be able to ambulate community distances at least 1000 ft WNL gait pattern without complaints or assistance Baseline: Goal status: Non Going using walker 10/28/2021   PLAN: PT FREQUENCY: 2-3 times per week    PT DURATION: 12 weeks   PLANNED INTERVENTIONS (unless contraindicated): aquatic PT, Canalith repositioning, cryotherapy, Electrical stimulation, Iontophoresis with 4 mg/ml dexamethasome, Moist heat, traction, Ultrasound, gait training, Therapeutic exercise, balance training, neuromuscular re-education, patient/family education,  prosthetic training, manual techniques, passive ROM, dry needling, taping, vasopnuematic device, vestibular, spinal manipulations, joint manipulations   PLAN FOR NEXT SESSION:  held BFR due to DVT contraindication, Emphasis remains flexion AROM, quadriceps strength, progress gait with SPC   Elsie Ra, PT, DPT 11/08/21 2:03 PM

## 2021-11-10 ENCOUNTER — Encounter: Payer: Self-pay | Admitting: Physical Therapy

## 2021-11-10 ENCOUNTER — Ambulatory Visit (INDEPENDENT_AMBULATORY_CARE_PROVIDER_SITE_OTHER): Admitting: Physical Therapy

## 2021-11-10 DIAGNOSIS — M25661 Stiffness of right knee, not elsewhere classified: Secondary | ICD-10-CM | POA: Diagnosis not present

## 2021-11-10 DIAGNOSIS — R600 Localized edema: Secondary | ICD-10-CM | POA: Diagnosis not present

## 2021-11-10 DIAGNOSIS — M6281 Muscle weakness (generalized): Secondary | ICD-10-CM

## 2021-11-10 DIAGNOSIS — R2689 Other abnormalities of gait and mobility: Secondary | ICD-10-CM | POA: Diagnosis not present

## 2021-11-10 DIAGNOSIS — M25561 Pain in right knee: Secondary | ICD-10-CM | POA: Diagnosis not present

## 2021-11-10 NOTE — Therapy (Signed)
OUTPATIENT PHYSICAL THERAPY TREATMENT NOTE   Patient Name: Sophia Long MRN: [id number] DOB:1981-01-08, 41 y.o., female Today's Date: 11/10/2021      END OF SESSION:   PT End of Session - 11/10/21 1157     Visit Number 10    Number of Visits 15    Date for PT Re-Evaluation 01/03/22    Authorization Type [employer]    PT Start Time 6503    PT Stop Time 1225    PT Time Calculation (min) 40 min    Activity Tolerance Patient tolerated treatment well;Patient limited by pain    Behavior During Therapy [facility] for tasks assessed/performed                 Past Medical History:  Diagnosis Date   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   ADHD (attention deficit hyperactivity disorder)    Asthma    Crohn's disease ([place]) 2000   DVT (deep venous thrombosis) ([place]) 2012   History of hiatal hernia 2018   Pulmonary embolism ([place]) 2012   Past Surgical History:  Procedure Laterality Date   ABDOMINAL HYSTERECTOMY  2009   partial   CESAREAN SECTION  2002   x 4 (2002, 2004, 2005, 2008)   CHOLECYSTECTOMY  2000   GASTRIC BYPASS  2019   HARDWARE REMOVAL Left 2015   knee   KNEE SURGERY Right 2012   KNEE SURGERY Left 2013   LAPAROSCOPIC GASTRIC SLEEVE RESECTION  2018   TENDON RECONSTRUCTION Right 2015   TOTAL KNEE ARTHROPLASTY Right 09/19/2021   Procedure: RIGHT TOTAL KNEE ARTHROPLASTY;  Surgeon: Leandrew Koyanagi, MD;  Location: [place];  Service: Orthopedics;  Laterality: Right;   TUBAL LIGATION  2005   Patient Active Problem List   Diagnosis Date Noted   Status post total right knee replacement 09/19/2021   Primary osteoarthritis of right knee 08/25/2021   Primary osteoarthritis of left knee 08/25/2021   Regional enteritis of large intestine ([place]) 08/15/2021   Other disorder of impulse control 08/15/2021   Asthma 08/15/2021   Hearing loss of left ear 08/15/2021   Encounter to establish care 08/15/2021   History of COVID-19 08/15/2021   Acute cough 08/15/2021    REFERRING PROVIDER: Leandrew Koyanagi, MD   REFERRING DIAG: [phone] (ICD-10-CM) - Hx of total knee replacement, right  ONSET DATE: 09/19/21  THERAPY DIAG:  Other abnormalities of gait and mobility  Stiffness of right knee, not elsewhere classified  Localized edema  Acute pain of right knee  Muscle weakness (generalized)  PERTINENT HISTORY: Rt TKA 09/19/21, DVT and PE 2012, 2 other previous knee surgeries patella realighmenta nd tendon reconstruction  PRECAUTIONS: None  SUBJECTIVE: She relays her Rt knee is sore but not too painful or swollen today  PAIN:  Are you having pain? Yes:  NPRS scale:  2-7/10 this week Pain location: Rt anterior knee Pain description: dull and achy but sharp if flexed too much Aggravating factors: static positioning, end range complaints.  Relieving factors: moving, ice, meds   OBJECTIVE:    PATIENT SURVEYS:  10/28/2021: FOTO 44 (Goal 59) 10/11/2021: FOTO 35% functional   MUSCLE LENGTH: 10/11/2021:  Tight H.S, and quads on Rt   PALPATION: 10/11/2021:  TTP and pain anterior knee  LE ROM:   AROM/PROM Right 10/11/2021 Right 10/13/2021  Right 10/20/21 Right 10/28/2021 Right 10/31/21 Right 11/10/21  Hip flexion          Hip extension          Hip abduction  Hip adduction          Hip internal rotation          Hip external rotation          Knee flexion 75/80 Supine heel slide AROM: 74   Supine AA: 83 Active 85 Active 85 Passive 95 P: 100  Knee extension 1/0  Seated AROM LAQ : -30 c pain  0 (Full extension) 0   Ankle dorsiflexion          Ankle plantarflexion          Ankle inversion          Ankle eversion           (Blank rows = not tested)   LE MMT:   MMT in sitting Right 10/11/2021 Right 10/31/21 Right 11/10/21  Hip flexion 3/5    Hip extension      Hip abduction 3/5    Hip adduction      Hip internal rotation      Hip external rotation      Knee flexion 3/5 4/5 4/5  Knee extension 2/5 3/5 3+/5  Ankle dorsiflexion      Ankle plantarflexion      Ankle  inversion      Ankle eversion       (Blank rows = not tested)   FUNCTIONAL TESTS:      GAIT: 10/11/2021:   Distance walked: 75 Assistive device utilized: Environmental consultant - 2 wheeled Level of assistance: supervision Comments: RW too short but is at its tallest level it can be adjusted, she has decreased hip/knee flexion and weight shift to Rt     TODAY'S TREATMENT: 11/10/2021 Therapeutic Exercise: Sci fit bike seat #12 rocking then progressed to full revolutions retro and eventually full revolutions fwd, 8 min total Leg Press R leg only 25# 2X 15. Slow eccentrics, then DL 75# 2X10 Knee flexion AAROM (L pushes R into flexion) 10 seconds X 10 reps Seated LAQ bilat reciprocally for 3 min on Rt with 2#  Gait with SPC  50 feet then 75 feet X 2 with quad tip in clinic 50 feet X 2 with supervision (navigated through 4 cones X 1 as well to challenge dynamic balance)  Manual therapy Rt knee PROM into flexion and flexion mobs grade 2 in sitting  11/08/2021 Therapeutic Exercise: Nu step X 10 min L6 holding max flexion ROM for a few seconds each step Leg Press R leg only 25# 2X 15. Slow eccentrics, then DL 75# 2X10 Knee flexion AAROM (L pushes R into flexion) 10 seconds X 3 min Seated LAQ 3X10 on Rt with 2# Supine SLR Rt X 5  Gait in // bars with light bilat UE support progressed to one UE support on left 3 round trips, then progressed to ambulating with SPC with quad tip in clinic 50 feet X 2 with CGA.  Manual therapy Rt knee PROM into flexion and flexion mobs grade 2 in sitting     PATIENT EDUCATION:  10/11/2021 Education details: HEP,POC, Person educated: Patient and Spouse Education method: Explanation, Demonstration, Verbal cues, and Handouts Education comprehension: verbalized understanding, returned demonstration, and needs further education     HOME EXERCISE PROGRAM: 10/11/2021 Access Code: [email or web address] URL: [email or web address] Date: 10/11/2021 Prepared by: Elsie Ra   Exercises - Supine Heel Slide with Strap  - 2 x daily - 6 x weekly - 1-2 sets - 10 reps - 5 hold - Supine Quadricep Sets  - 2 x daily -  6 x weekly - 1-2 sets - 10 reps - 5 sec hold - Seated Long Arc Quad  - 2 x daily - 6 x weekly - 2-3 sets - 10 reps - Seated Knee Flexion Stretch  - 2 x daily - 6 x weekly - 1-2 sets - 10 reps - 5 sec hold - Seated Hamstring Stretch  - 2 x daily - 6 x weekly - 1 sets - 2-3 reps - 30 hold               ASSESSMENT:   CLINICAL IMPRESSION: She showed improvements in Rt knee strength and ROM with updated measurements. She is also advancing her gait as tolerated, See above for details. PT recommending to continue with current POC.   OBJECTIVE IMPAIRMENTS: decreased activity tolerance, difficulty walking, decreased balance, decreased endurance, decreased mobility, decreased ROM, decreased strength, impaired flexibility, impaired sLE use, postural dysfunction, and pain.   ACTIVITY LIMITATIONS: bending, lifting, carry, locomotion, cleaning, community activity, driving, and or occupation   PERSONAL FACTORS: S/P RIGHT TKA-09/19/2021, DVT and PE 2012, 2 other previous knee surgeries patella realignment and tendon reconstruction are also affecting patient's functional outcome.   REHAB POTENTIAL: good   CLINICAL DECISION MAKING: Stable/uncomplicated   EVALUATION COMPLEXITY: Low   GOALS: Short term PT Goals Target date: 11/08/2021 Pt will be I and compliant with HEP. Baseline:  Goal status: Met 10/19/2021 Pt will decrease pain by 25% overall Baseline: Goal status: on going 10/28/2021   Long term PT goals Target date: 01/03/2022 Pt will improve Rt knee AROM to 0-100 degrees to improve functional mobility Baseline: Goal status: 0 - 85 10/28/2021 Pt will improve  hip/knee strength to at least 5-/5 MMT to improve functional strength Baseline: Goal status: On Going 10/28/2021 Pt will improve FOTO to at least 59% functional to show improved function Baseline:  44 Goal status: On Going 10/28/2021 Pt will reduce pain by overall 50% overall with usual activity Baseline: Goal status: On Going 10/28/2021 Pt will be able to ambulate community distances at least 1000 ft WNL gait pattern without complaints or assistance Baseline: Goal status: Non Going using walker 10/28/2021   PLAN: PT FREQUENCY: 2-3 times per week    PT DURATION: 12 weeks   PLANNED INTERVENTIONS (unless contraindicated): aquatic PT, Canalith repositioning, cryotherapy, Electrical stimulation, Iontophoresis with 4 mg/ml dexamethasome, Moist heat, traction, Ultrasound, gait training, Therapeutic exercise, balance training, neuromuscular re-education, patient/family education, prosthetic training, manual techniques, passive ROM, dry needling, taping, vasopnuematic device, vestibular, spinal manipulations, joint manipulations   PLAN FOR NEXT SESSION:  held BFR due to DVT contraindication, Emphasis remains flexion AROM, quadriceps strength, progress gait with SPC   Elsie Ra, PT, DPT 11/10/21 11:58 AM

## 2021-11-21 ENCOUNTER — Ambulatory Visit (INDEPENDENT_AMBULATORY_CARE_PROVIDER_SITE_OTHER): Admitting: Physical Therapy

## 2021-11-21 ENCOUNTER — Encounter: Payer: Self-pay | Admitting: Physical Therapy

## 2021-11-21 ENCOUNTER — Other Ambulatory Visit: Payer: Self-pay | Admitting: Physician Assistant

## 2021-11-21 ENCOUNTER — Encounter: Payer: Self-pay | Admitting: Orthopaedic Surgery

## 2021-11-21 DIAGNOSIS — M6281 Muscle weakness (generalized): Secondary | ICD-10-CM

## 2021-11-21 DIAGNOSIS — R2689 Other abnormalities of gait and mobility: Secondary | ICD-10-CM | POA: Diagnosis not present

## 2021-11-21 DIAGNOSIS — M25661 Stiffness of right knee, not elsewhere classified: Secondary | ICD-10-CM

## 2021-11-21 DIAGNOSIS — R600 Localized edema: Secondary | ICD-10-CM

## 2021-11-21 DIAGNOSIS — M25561 Pain in right knee: Secondary | ICD-10-CM | POA: Diagnosis not present

## 2021-11-21 MED ORDER — OXYCODONE-ACETAMINOPHEN 5-325 MG PO TABS: 1.0000 | ORAL_TABLET | Freq: Every day | ORAL | 0 refills | Status: DC | PRN

## 2021-11-21 NOTE — Telephone Encounter (Signed)
I sent this in earlier

## 2021-11-21 NOTE — Therapy (Signed)
OUTPATIENT PHYSICAL THERAPY TREATMENT NOTE   Patient Name: Sophia Long MRN: [id number] DOB:1980-10-14, 41 y.o., female Today's Date: 11/21/2021      END OF SESSION:   PT End of Session - 11/21/21 1406     Visit Number 11    Number of Visits 15    Date for PT Re-Evaluation 01/03/22    Authorization Type [employer]    PT Start Time 1350    PT Stop Time 1435    PT Time Calculation (min) 45 min    Activity Tolerance Patient tolerated treatment well;Patient limited by pain    Behavior During Therapy [facility] for tasks assessed/performed                 Past Medical History:  Diagnosis Date   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   ADHD (attention deficit hyperactivity disorder)    Asthma    Crohn's disease ([place]) 2000   DVT (deep venous thrombosis) ([place]) 2012   History of hiatal hernia 2018   Pulmonary embolism ([place]) 2012   Past Surgical History:  Procedure Laterality Date   ABDOMINAL HYSTERECTOMY  2009   partial   CESAREAN SECTION  2002   x 4 (2002, 2004, 2005, 2008)   CHOLECYSTECTOMY  2000   GASTRIC BYPASS  2019   HARDWARE REMOVAL Left 2015   knee   KNEE SURGERY Right 2012   KNEE SURGERY Left 2013   LAPAROSCOPIC GASTRIC SLEEVE RESECTION  2018   TENDON RECONSTRUCTION Right 2015   TOTAL KNEE ARTHROPLASTY Right 09/19/2021   Procedure: RIGHT TOTAL KNEE ARTHROPLASTY;  Surgeon: Leandrew Koyanagi, MD;  Location: [place];  Service: Orthopedics;  Laterality: Right;   TUBAL LIGATION  2005   Patient Active Problem List   Diagnosis Date Noted   Status post total right knee replacement 09/19/2021   Primary osteoarthritis of right knee 08/25/2021   Primary osteoarthritis of left knee 08/25/2021   Regional enteritis of large intestine ([place]) 08/15/2021   Other disorder of impulse control 08/15/2021   Asthma 08/15/2021   Hearing loss of left ear 08/15/2021   Encounter to establish care 08/15/2021   History of COVID-19 08/15/2021   Acute cough 08/15/2021    REFERRING PROVIDER: Leandrew Koyanagi, MD   REFERRING DIAG: [phone] (ICD-10-CM) - Hx of total knee replacement, right  ONSET DATE: 09/19/21  THERAPY DIAG:  Other abnormalities of gait and mobility  Stiffness of right knee, not elsewhere classified  Localized edema  Acute pain of right knee  Muscle weakness (generalized)  PERTINENT HISTORY: Rt TKA 09/19/21, DVT and PE 2012, 2 other previous knee surgeries patella realighmenta nd tendon reconstruction  PRECAUTIONS: None  SUBJECTIVE: She relays her Rt knee is sore but feels better after PT  PAIN:  Are you having pain? Yes: NPRS scale:  4/10 today Pain location: Rt anterior knee Pain description: dull and achy but sharp if flexed too much Aggravating factors: static positioning, end range complaints.  Relieving factors: moving, ice, meds   OBJECTIVE:    PATIENT SURVEYS:  10/28/2021: FOTO 44 (Goal 59) 10/11/2021: FOTO 35% functional   MUSCLE LENGTH: 10/11/2021:  Tight H.S, and quads on Rt   PALPATION: 10/11/2021:  TTP and pain anterior knee  LE ROM:   AROM/PROM Right 10/11/2021 Right 10/13/2021  Right 10/20/21 Right 10/28/2021 Right 10/31/21 Right 11/10/21  Hip flexion          Hip extension          Hip abduction  Hip adduction          Hip internal rotation          Hip external rotation          Knee flexion 75/80 Supine heel slide AROM: 74   Supine AA: 83 Active 85 Active 85 Passive 95 P: 100  Knee extension 1/0  Seated AROM LAQ : -30 c pain  0 (Full extension) 0   Ankle dorsiflexion          Ankle plantarflexion          Ankle inversion          Ankle eversion           (Blank rows = not tested)   LE MMT:   MMT in sitting Right 10/11/2021 Right 10/31/21 Right 11/10/21  Hip flexion 3/5    Hip extension      Hip abduction 3/5    Hip adduction      Hip internal rotation      Hip external rotation      Knee flexion 3/5 4/5 4/5  Knee extension 2/5 3/5 3+/5  Ankle dorsiflexion      Ankle plantarflexion      Ankle inversion      Ankle  eversion       (Blank rows = not tested)   FUNCTIONAL TESTS:      GAIT: 10/11/2021:   Distance walked: 75 Assistive device utilized: Environmental consultant - 2 wheeled Level of assistance: supervision Comments: RW too short but is at its tallest level it can be adjusted, she has decreased hip/knee flexion and weight shift to Rt     TODAY'S TREATMENT: 11/21/2021 Therapeutic Exercise: Recumbent bike rocking then progressed to full revolutions retro and eventually full revolutions fwd, 10 min total Supine SLR on Rt 2X10 Supine knee flexion stretch AAROM with strap 10 sec X 10 Leg Press R leg only 31# X 20 . Slow eccentrics, then DL 75# 2X10 Knee flexion AAROM (L pushes R into flexion) 10 seconds X 10 reps Seated LAQ bilat reciprocally for 3 min on Rt with 3#  Manual therapy Rt knee PROM into flexion and flexion mobs grade 2-3 in supine  11/10/2021 Therapeutic Exercise: Sci fit bike seat #12 rocking then progressed to full revolutions retro and eventually full revolutions fwd, 8 min total Leg Press R leg only 25# 2X 15. Slow eccentrics, then DL 75# 2X10 Knee flexion AAROM (L pushes R into flexion) 10 seconds X 10 reps Seated LAQ bilat reciprocally for 3 min on Rt with 2#  Gait with SPC  50 feet then 75 feet X 2 with quad tip in clinic 50 feet X 2 with supervision (navigated through 4 cones X 1 as well to challenge dynamic balance)  Manual therapy Rt knee PROM into flexion and flexion mobs grade 2 in sitting  11/08/2021 Therapeutic Exercise: Nu step X 10 min L6 holding max flexion ROM for a few seconds each step Leg Press R leg only 25# 2X 15. Slow eccentrics, then DL 75# 2X10 Knee flexion AAROM (L pushes R into flexion) 10 seconds X 3 min Seated LAQ 3X10 on Rt with 2# Supine SLR Rt X 5  Gait in // bars with light bilat UE support progressed to one UE support on left 3 round trips, then progressed to ambulating with SPC with quad tip in clinic 50 feet X 2 with CGA.  Manual therapy Rt knee PROM  into flexion and flexion mobs grade 2 in  sitting     PATIENT EDUCATION:  10/11/2021 Education details: HEP,POC, Person educated: Patient and Spouse Education method: Explanation, Demonstration, Verbal cues, and Handouts Education comprehension: verbalized understanding, returned demonstration, and needs further education     HOME EXERCISE PROGRAM: 10/11/2021 Access Code: [email or web address] URL: [email or web address] Date: 10/11/2021 Prepared by: Elsie Ra   Exercises - Supine Heel Slide with Strap  - 2 x daily - 6 x weekly - 1-2 sets - 10 reps - 5 hold - Supine Quadricep Sets  - 2 x daily - 6 x weekly - 1-2 sets - 10 reps - 5 sec hold - Seated Long Arc Quad  - 2 x daily - 6 x weekly - 2-3 sets - 10 reps - Seated Knee Flexion Stretch  - 2 x daily - 6 x weekly - 1-2 sets - 10 reps - 5 sec hold - Seated Hamstring Stretch  - 2 x daily - 6 x weekly - 1 sets - 2-3 reps - 30 hold               ASSESSMENT:   CLINICAL IMPRESSION: Due to multiple surgeries on her Rt knee progress is slow but this was expected. She is making slow steady progress overall and we will continue to work to improve her Rt knee function as tolerated.   OBJECTIVE IMPAIRMENTS: decreased activity tolerance, difficulty walking, decreased balance, decreased endurance, decreased mobility, decreased ROM, decreased strength, impaired flexibility, impaired sLE use, postural dysfunction, and pain.   ACTIVITY LIMITATIONS: bending, lifting, carry, locomotion, cleaning, community activity, driving, and or occupation   PERSONAL FACTORS: S/P RIGHT TKA-09/19/2021, DVT and PE 2012, 2 other previous knee surgeries patella realignment and tendon reconstruction are also affecting patient's functional outcome.   REHAB POTENTIAL: good   CLINICAL DECISION MAKING: Stable/uncomplicated   EVALUATION COMPLEXITY: Low   GOALS: Short term PT Goals Target date: 11/08/2021 Pt will be I and compliant with HEP. Baseline:  Goal status:  Met 10/19/2021 Pt will decrease pain by 25% overall Baseline: Goal status: on going 10/28/2021   Long term PT goals Target date: 01/03/2022 Pt will improve Rt knee AROM to 0-100 degrees to improve functional mobility Baseline: Goal status: 0 - 85 10/28/2021 Pt will improve  hip/knee strength to at least 5-/5 MMT to improve functional strength Baseline: Goal status: On Going 10/28/2021 Pt will improve FOTO to at least 59% functional to show improved function Baseline: 44 Goal status: On Going 10/28/2021 Pt will reduce pain by overall 50% overall with usual activity Baseline: Goal status: On Going 10/28/2021 Pt will be able to ambulate community distances at least 1000 ft WNL gait pattern without complaints or assistance Baseline: Goal status: Non Going using walker 10/28/2021   PLAN: PT FREQUENCY: 2-3 times per week    PT DURATION: 12 weeks   PLANNED INTERVENTIONS (unless contraindicated): aquatic PT, Canalith repositioning, cryotherapy, Electrical stimulation, Iontophoresis with 4 mg/ml dexamethasome, Moist heat, traction, Ultrasound, gait training, Therapeutic exercise, balance training, neuromuscular re-education, patient/family education, prosthetic training, manual techniques, passive ROM, dry needling, taping, vasopnuematic device, vestibular, spinal manipulations, joint manipulations   PLAN FOR NEXT SESSION:  held BFR due to DVT contraindication, Emphasis remains flexion AROM, quadriceps strength, progress gait with SPC as able.   Elsie Ra, PT, DPT 11/21/21 2:08 PM

## 2021-11-21 NOTE — Telephone Encounter (Signed)
I have sent in one last percocet, but we really need to wean to norco after this rx as she is two months out from surgery.

## 2021-11-22 ENCOUNTER — Other Ambulatory Visit: Payer: Self-pay | Admitting: Family

## 2021-11-22 DIAGNOSIS — F900 Attention-deficit hyperactivity disorder, predominantly inattentive type: Secondary | ICD-10-CM

## 2021-11-23 ENCOUNTER — Ambulatory Visit (INDEPENDENT_AMBULATORY_CARE_PROVIDER_SITE_OTHER): Admitting: Physical Therapy

## 2021-11-23 ENCOUNTER — Encounter: Payer: Self-pay | Admitting: Physical Therapy

## 2021-11-23 DIAGNOSIS — M25561 Pain in right knee: Secondary | ICD-10-CM | POA: Diagnosis not present

## 2021-11-23 DIAGNOSIS — M25661 Stiffness of right knee, not elsewhere classified: Secondary | ICD-10-CM | POA: Diagnosis not present

## 2021-11-23 DIAGNOSIS — M6281 Muscle weakness (generalized): Secondary | ICD-10-CM

## 2021-11-23 DIAGNOSIS — R600 Localized edema: Secondary | ICD-10-CM | POA: Diagnosis not present

## 2021-11-23 DIAGNOSIS — R2689 Other abnormalities of gait and mobility: Secondary | ICD-10-CM | POA: Diagnosis not present

## 2021-11-23 NOTE — Therapy (Signed)
OUTPATIENT PHYSICAL THERAPY TREATMENT NOTE   Patient Name: Sophia Long MRN: [id number] DOB:[birth year], 41 y.o., female Today's Date: 11/23/2021      END OF SESSION:   PT End of Session - 11/23/21 1353     Visit Number 12    Number of Visits 15    Date for PT Re-Evaluation 01/03/22    Authorization Type [employer]    PT Start Time 1352    PT Stop Time 1430    PT Time Calculation (min) 38 min    Activity Tolerance Patient tolerated treatment well;Patient limited by pain    Behavior During Therapy 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   ADHD (attention deficit hyperactivity disorder)    Asthma    Crohn's disease ([place]) 2000   DVT (deep venous thrombosis) ([place]) 2012   History of hiatal hernia 2018   Pulmonary embolism ([place]) 2012   Past Surgical History:  Procedure Laterality Date   ABDOMINAL HYSTERECTOMY  2009   partial   CESAREAN SECTION  2002   x 4 (2002, 2004, 2005, 2008)   CHOLECYSTECTOMY  2000   GASTRIC BYPASS  2019   HARDWARE REMOVAL Left 2015   knee   KNEE SURGERY Right 2012   KNEE SURGERY Left 2013   LAPAROSCOPIC GASTRIC SLEEVE RESECTION  2018   TENDON RECONSTRUCTION Right 2015   TOTAL KNEE ARTHROPLASTY Right 09/19/2021   Procedure: RIGHT TOTAL KNEE ARTHROPLASTY;  Surgeon: Leandrew Koyanagi, MD;  Location: [place];  Service: Orthopedics;  Laterality: Right;   TUBAL LIGATION  2005   Patient Active Problem List   Diagnosis Date Noted   Status post total right knee replacement 09/19/2021   Primary osteoarthritis of right knee 08/25/2021   Primary osteoarthritis of left knee 08/25/2021   Regional enteritis of large intestine ([place]) 08/15/2021   Other disorder of impulse control 08/15/2021   Asthma 08/15/2021   Hearing loss of left ear 08/15/2021   Encounter to establish care 08/15/2021   History of COVID-19 08/15/2021   Acute cough 08/15/2021    REFERRING PROVIDER: Leandrew Koyanagi, MD   REFERRING DIAG: [phone] (ICD-10-CM) - Hx of total knee replacement, right  ONSET DATE: 09/19/21  THERAPY DIAG:  Other abnormalities of gait and mobility  Stiffness of right knee, not elsewhere classified  Localized edema  Acute pain of right knee  Muscle weakness (generalized)  PERTINENT HISTORY: Rt TKA 09/19/21, DVT and PE 2012, 2 other previous knee surgeries patella realighmenta nd tendon reconstruction  PRECAUTIONS: None  SUBJECTIVE: She relays her Rt knee is not as sore today but still has complaints of stiffness.  PAIN:  Are you having pain? Yes: NPRS scale:  3/10 today Pain location: Rt anterior knee Pain description: dull and achy but sharp if flexed too much Aggravating factors: static positioning, end range complaints.  Relieving factors: moving, ice, meds   OBJECTIVE:    PATIENT SURVEYS:  10/28/2021: FOTO 44 (Goal 59) 10/11/2021: FOTO 35% functional   MUSCLE LENGTH: 10/11/2021:  Tight H.S, and quads on Rt   PALPATION: 10/11/2021:  TTP and pain anterior knee  LE ROM:   AROM/PROM Right 10/11/2021 Right 10/13/2021  Right 10/20/21 Right 10/28/2021 Right 10/31/21 Right 11/10/21 Right 11/23/21  Hip flexion           Hip extension  Hip abduction           Hip adduction           Hip internal rotation           Hip external rotation           Knee flexion 75/80 Supine heel slide AROM: 74   Supine AA: 83 Active 85 Active 85 Passive 95 P: 100 P: 104    Knee extension 1/0  Seated AROM LAQ : -30 c pain  0 (Full extension) 0    Ankle dorsiflexion           Ankle plantarflexion           Ankle inversion           Ankle eversion            (Blank rows = not tested)   LE MMT:   MMT in sitting Right 10/11/2021 Right 10/31/21 Right 11/10/21  Hip flexion 3/5    Hip extension      Hip abduction 3/5    Hip adduction      Hip internal rotation      Hip external rotation      Knee flexion 3/5 4/5 4/5  Knee extension 2/5 3/5 3+/5  Ankle dorsiflexion       Ankle plantarflexion      Ankle inversion      Ankle eversion       (Blank rows = not tested)   FUNCTIONAL TESTS:      GAIT: 10/11/2021:   Distance walked: 75 Assistive device utilized: Walker - 2 wheeled Level of assistance: supervision Comments: RW too short but is at its tallest level it can be adjusted, she has decreased hip/knee flexion and weight    shift to Rt  11/23/21 Gait with SPC 100 feet with supervision, gait at counter top for fwd and retro walking with SPC and hand hovering over counter top 5 round trips.     TODAY'S TREATMENT: 11/23/2021 Therapeutic Exercise: Recumbent bike rocking then progressed to full revolutions retro and eventually full revolutions fwd, 10 min total Gait see details above Leg Press R leg only 31# X 25 . Slow eccentrics, then DL 81# 2X10 Knee flexion AAROM (L pushes R into flexion) 10 seconds X 10 reps Seated LAQ bilat reciprocally for 3 min on Rt with 3# Sidestepping at counter top no UE support 5 trips Standing mini squats at counter X10  Manual therapy Rt knee PROM into flexion and flexion mobs grade 2-3 in supine  11/21/2021 Therapeutic Exercise: Recumbent bike rocking then progressed to full revolutions retro and eventually full revolutions fwd, 10 min total Supine SLR on Rt 2X10 Supine knee flexion stretch AAROM with strap 10 sec X 10 Leg Press R leg only 31# X 20 . Slow eccentrics, then DL 75# 2X10 Knee flexion AAROM (L pushes R into flexion) 10 seconds X 10 reps Seated LAQ bilat reciprocally for 3 min on Rt with 3#  Manual therapy Rt knee PROM into flexion and flexion mobs grade 2-3 in supine  11/10/2021 Therapeutic Exercise: Sci fit bike seat #12 rocking then progressed to full revolutions retro and eventually full revolutions fwd, 8 min total Leg Press R leg only 25# 2X 15. Slow eccentrics, then DL 75# 2X10 Knee flexion AAROM (L pushes R into flexion) 10 seconds X 10 reps Seated LAQ bilat reciprocally for 3 min on Rt with  2#  Gait with SPC  50 feet then 75 feet   X 2 with quad tip in clinic 50 feet X 2 with supervision (navigated through 4 cones X 1 as well to challenge dynamic balance)  Manual therapy Rt knee PROM into flexion and flexion mobs grade 2 in sitting  11/08/2021 Therapeutic Exercise: Nu step X 10 min L6 holding max flexion ROM for a few seconds each step Leg Press R leg only 25# 2X 15. Slow eccentrics, then DL 75# 2X10 Knee flexion AAROM (L pushes R into flexion) 10 seconds X 3 min Seated LAQ 3X10 on Rt with 2# Supine SLR Rt X 5  Gait in // bars with light bilat UE support progressed to one UE support on left 3 round trips, then progressed to ambulating with SPC with quad tip in clinic 50 feet X 2 with CGA.  Manual therapy Rt knee PROM into flexion and flexion mobs grade 2 in sitting     PATIENT EDUCATION:  10/11/2021 Education details: HEP,POC, Person educated: Patient and Spouse Education method: Explanation, Demonstration, Verbal cues, and Handouts Education comprehension: verbalized understanding, returned demonstration, and needs further education     HOME EXERCISE PROGRAM: 10/11/2021 Access Code: [email or web address] URL: [email or web address] Date: 10/11/2021 Prepared by: Elsie Ra   Exercises - Supine Heel Slide with Strap  - 2 x daily - 6 x weekly - 1-2 sets - 10 reps - 5 hold - Supine Quadricep Sets  - 2 x daily - 6 x weekly - 1-2 sets - 10 reps - 5 sec hold - Seated Long Arc Quad  - 2 x daily - 6 x weekly - 2-3 sets - 10 reps - Seated Knee Flexion Stretch  - 2 x daily - 6 x weekly - 1-2 sets - 10 reps - 5 sec hold - Seated Hamstring Stretch  - 2 x daily - 6 x weekly - 1 sets - 2-3 reps - 30 hold               ASSESSMENT:   CLINICAL IMPRESSION: She showed improved overall gait today trying to reduce from RW to [facility] with her. She also showed improved overall knee flexion ROM. We will continue to work to improve this with PT.   OBJECTIVE IMPAIRMENTS: decreased  activity tolerance, difficulty walking, decreased balance, decreased endurance, decreased mobility, decreased ROM, decreased strength, impaired flexibility, impaired sLE use, postural dysfunction, and pain.   ACTIVITY LIMITATIONS: bending, lifting, carry, locomotion, cleaning, community activity, driving, and or occupation   PERSONAL FACTORS: S/P RIGHT TKA-09/19/2021, DVT and PE 2012, 2 other previous knee surgeries patella realignment and tendon reconstruction are also affecting patient's functional outcome.   REHAB POTENTIAL: good   CLINICAL DECISION MAKING: Stable/uncomplicated   EVALUATION COMPLEXITY: Low   GOALS: Short term PT Goals Target date: 11/08/2021 Pt will be I and compliant with HEP. Baseline:  Goal status: Met 10/19/2021 Pt will decrease pain by 25% overall Baseline: Goal status: on going 10/28/2021   Long term PT goals Target date: 01/03/2022 Pt will improve Rt knee AROM to 0-100 degrees to improve functional mobility Baseline: Goal status: 0 - 85 10/28/2021 Pt will improve  hip/knee strength to at least 5-/5 MMT to improve functional strength Baseline: Goal status: On Going 10/28/2021 Pt will improve FOTO to at least 59% functional to show improved function Baseline: 44 Goal status: On Going 10/28/2021 Pt will reduce pain by overall 50% overall with usual activity Baseline: Goal status: On Going 10/28/2021 Pt will be able to ambulate community distances at least 1000 ft WNL gait pattern without complaints  or assistance Baseline: Goal status: Non Going using walker 10/28/2021   PLAN: PT FREQUENCY: 2-3 times per week    PT DURATION: 12 weeks   PLANNED INTERVENTIONS (unless contraindicated): aquatic PT, Canalith repositioning, cryotherapy, Electrical stimulation, Iontophoresis with 4 mg/ml dexamethasome, Moist heat, traction, Ultrasound, gait training, Therapeutic exercise, balance training, neuromuscular re-education, patient/family education, prosthetic training, manual  techniques, passive ROM, dry needling, taping, vasopnuematic device, vestibular, spinal manipulations, joint manipulations   PLAN FOR NEXT SESSION:  held BFR due to DVT contraindication, Emphasis remains flexion AROM, quadriceps strength, progress gait with SPC as able.    , PT, DPT 11/23/21 2:39 PM        

## 2021-11-24 NOTE — Telephone Encounter (Signed)
Schedule appointment?

## 2021-11-28 ENCOUNTER — Encounter: Admitting: Physical Therapy

## 2021-11-30 ENCOUNTER — Ambulatory Visit (INDEPENDENT_AMBULATORY_CARE_PROVIDER_SITE_OTHER): Admitting: Physical Therapy

## 2021-11-30 ENCOUNTER — Encounter: Payer: Self-pay | Admitting: Physical Therapy

## 2021-11-30 DIAGNOSIS — R600 Localized edema: Secondary | ICD-10-CM

## 2021-11-30 DIAGNOSIS — M25561 Pain in right knee: Secondary | ICD-10-CM | POA: Diagnosis not present

## 2021-11-30 DIAGNOSIS — R2689 Other abnormalities of gait and mobility: Secondary | ICD-10-CM

## 2021-11-30 DIAGNOSIS — M6281 Muscle weakness (generalized): Secondary | ICD-10-CM

## 2021-11-30 DIAGNOSIS — M25661 Stiffness of right knee, not elsewhere classified: Secondary | ICD-10-CM | POA: Diagnosis not present

## 2021-11-30 NOTE — Therapy (Signed)
OUTPATIENT PHYSICAL THERAPY TREATMENT NOTE   Patient Name: Sophia Long MRN: [id number] DOB:[birth year], 41 y.o., female Today's Date: 11/30/2021      END OF SESSION:   PT End of Session - 11/30/21 1343     Visit Number 13    Number of Visits 15    Date for PT Re-Evaluation 01/03/22    Authorization Type [employer]    PT Start Time 1343    PT Stop Time 1914    PT Time Calculation (min) 39 min    Activity Tolerance Patient tolerated treatment well;Patient limited by pain    Behavior During Therapy [facility] for tasks assessed/performed                 Past Medical History:  Diagnosis Date   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   ADHD (attention deficit hyperactivity disorder)    Asthma    Crohn's disease ([place]) 2000   DVT (deep venous thrombosis) ([place]) 2012   History of hiatal hernia 2018   Pulmonary embolism ([place]) 2012   Past Surgical History:  Procedure Laterality Date   ABDOMINAL HYSTERECTOMY  2009   partial   CESAREAN SECTION  2002   x 4 (2002, 2004, 2005, 2008)   CHOLECYSTECTOMY  2000   GASTRIC BYPASS  2019   HARDWARE REMOVAL Left 2015   knee   KNEE SURGERY Right 2012   KNEE SURGERY Left 2013   LAPAROSCOPIC GASTRIC SLEEVE RESECTION  2018   TENDON RECONSTRUCTION Right 2015   TOTAL KNEE ARTHROPLASTY Right 09/19/2021   Procedure: RIGHT TOTAL KNEE ARTHROPLASTY;  Surgeon: Leandrew Koyanagi, MD;  Location: [place];  Service: Orthopedics;  Laterality: Right;   TUBAL LIGATION  2005   Patient Active Problem List   Diagnosis Date Noted   Status post total right knee replacement 09/19/2021   Primary osteoarthritis of right knee 08/25/2021   Primary osteoarthritis of left knee 08/25/2021   Regional enteritis of large intestine ([place]) 08/15/2021   Other disorder of impulse control 08/15/2021   Asthma 08/15/2021   Hearing loss of left ear 08/15/2021   Encounter to establish care 08/15/2021   History of COVID-19 08/15/2021   Acute cough 08/15/2021    REFERRING PROVIDER: Leandrew Koyanagi, MD   REFERRING DIAG: [phone] (ICD-10-CM) - Hx of total knee replacement, right  ONSET DATE: 09/19/21  THERAPY DIAG:  Stiffness of right knee, not elsewhere classified  Other abnormalities of gait and mobility  Localized edema  Acute pain of right knee  Muscle weakness (generalized)  PERTINENT HISTORY: Rt TKA 09/19/21, DVT and PE 2012, 2 other previous knee surgeries patella realighmenta nd tendon reconstruction  PRECAUTIONS: None  SUBJECTIVE: She relays her Rt knee is very stiff today, had pain last night.  PAIN:  Are you having pain? Yes: NPRS scale:  2/10 today Pain location: Rt anterior knee Pain description: dull and achy but sharp if flexed too much Aggravating factors: static positioning, end range complaints.  Relieving factors: moving, ice, meds   OBJECTIVE:    PATIENT SURVEYS:  10/28/2021: FOTO 44 (Goal 59) 10/11/2021: FOTO 35% functional   MUSCLE LENGTH: 10/11/2021:  Tight H.S, and quads on Rt   PALPATION: 10/11/2021:  TTP and pain anterior knee  LE ROM:   AROM/PROM Right 10/11/2021 Right 10/13/2021  Right 10/20/21 Right 10/28/2021 Right 10/31/21 Right 11/10/21 Right 11/23/21  Hip flexion           Hip extension           Hip abduction  Hip adduction           Hip internal rotation           Hip external rotation           Knee flexion 75/80 Supine heel slide AROM: 74   Supine AA: 83 Active 85 Active 85 Passive 95 P: 100 P: 104    Knee extension 1/0  Seated AROM LAQ : -30 c pain  0 (Full extension) 0    Ankle dorsiflexion           Ankle plantarflexion           Ankle inversion           Ankle eversion            (Blank rows = not tested)   LE MMT:   MMT in sitting Right 10/11/2021 Right 10/31/21 Right 11/10/21  Hip flexion 3/5    Hip extension      Hip abduction 3/5    Hip adduction      Hip internal rotation      Hip external rotation      Knee flexion 3/5 4/5 4/5  Knee extension 2/5 3/5 3+/5  Ankle dorsiflexion      Ankle  plantarflexion      Ankle inversion      Ankle eversion       (Blank rows = not tested)   FUNCTIONAL TESTS:      GAIT: 10/11/2021:   Distance walked: 75 Assistive device utilized: Environmental consultant - 2 wheeled Level of assistance: supervision Comments: RW too short but is at its tallest level it can be adjusted, she has decreased hip/knee flexion and weight    shift to Rt  11/23/21 Gait with SPC 100 feet with supervision, gait at counter top for fwd and retro walking with SPC and hand hovering over counter top 5 round trips.  11/30/21 Gait with SPC 150 feet with supervision     TODAY'S TREATMENT: 11/30/2021 Therapeutic Exercise: Recumbent bike rocking then progressed to full revolutions retro and eventually full revolutions fwd, 10 min total Gait see details above Leg Press R leg only 31# X 20 . Slow eccentrics, then DL 87# 2X10 Step ups fwd and lateral for Rt leg 4 inch X 10 each using one UE support Knee flexion AAROM (L pushes R into flexion) 10 seconds X 10 reps Seated LAQ bilat reciprocally for 2 min with 3# Sit to stands no UE support with slow eccentric lower Supine SLR 2X10  Manual therapy Rt knee PROM into flexion and flexion mobs grade 2-3 in supine  11/23/2021 Therapeutic Exercise: Recumbent bike rocking then progressed to full revolutions retro and eventually full revolutions fwd, 10 min total Gait see details above Leg Press R leg only 31# X 25 . Slow eccentrics, then DL 81# 2X10 Knee flexion AAROM (L pushes R into flexion) 10 seconds X 10 reps Seated LAQ bilat reciprocally for 3 min on Rt with 3# Sidestepping at counter top no UE support 5 trips Standing mini squats at counter X10  Manual therapy Rt knee PROM into flexion and flexion mobs grade 2-3 in supine  11/21/2021 Therapeutic Exercise: Recumbent bike rocking then progressed to full revolutions retro and eventually full revolutions fwd, 10 min total Supine SLR on Rt 2X10 Supine knee flexion stretch AAROM with strap  10 sec X 10 Leg Press R leg only 31# X 20 . Slow eccentrics, then DL 75# 2X10 Knee flexion AAROM (L pushes R into  flexion) 10 seconds X 10 reps Seated LAQ bilat reciprocally for 3 min on Rt with 3#  Manual therapy Rt knee PROM into flexion and flexion mobs grade 2-3 in supine     PATIENT EDUCATION:  10/11/2021 Education details: HEP,POC, Person educated: Patient and Spouse Education method: Explanation, Demonstration, Verbal cues, and Handouts Education comprehension: verbalized understanding, returned demonstration, and needs further education     HOME EXERCISE PROGRAM: 10/11/2021 Access Code: [email or web address] URL: [email or web address] Date: 10/11/2021 Prepared by: Elsie Ra   Exercises - Supine Heel Slide with Strap  - 2 x daily - 6 x weekly - 1-2 sets - 10 reps - 5 hold - Supine Quadricep Sets  - 2 x daily - 6 x weekly - 1-2 sets - 10 reps - 5 sec hold - Seated Long Arc Quad  - 2 x daily - 6 x weekly - 2-3 sets - 10 reps - Seated Knee Flexion Stretch  - 2 x daily - 6 x weekly - 1-2 sets - 10 reps - 5 sec hold - Seated Hamstring Stretch  - 2 x daily - 6 x weekly - 1 sets - 2-3 reps - 30 hold               ASSESSMENT:   CLINICAL IMPRESSION: She is slowly progressing but still missing significant Rt knee strength and flexion ROM. We will continue to focus on improving this with PT. She will need recert soon to add additional visits and weeks into PT plan of care.    OBJECTIVE IMPAIRMENTS: decreased activity tolerance, difficulty walking, decreased balance, decreased endurance, decreased mobility, decreased ROM, decreased strength, impaired flexibility, impaired sLE use, postural dysfunction, and pain.   ACTIVITY LIMITATIONS: bending, lifting, carry, locomotion, cleaning, community activity, driving, and or occupation   PERSONAL FACTORS: S/P RIGHT TKA-09/19/2021, DVT and PE 2012, 2 other previous knee surgeries patella realignment and tendon reconstruction are also  affecting patient's functional outcome.   REHAB POTENTIAL: good   CLINICAL DECISION MAKING: Stable/uncomplicated   EVALUATION COMPLEXITY: Low   GOALS: Short term PT Goals Target date: 11/08/2021 Pt will be I and compliant with HEP. Baseline:  Goal status: Met 10/19/2021 Pt will decrease pain by 25% overall Baseline: Goal status: MET 11/30/21   Long term PT goals Target date: 01/03/2022 Pt will improve Rt knee AROM to 0-100 degrees to improve functional mobility Baseline: Goal status: 0 - 85 10/28/2021 Pt will improve  hip/knee strength to at least 5-/5 MMT to improve functional strength Baseline: Goal status: On Going 10/28/2021 Pt will improve FOTO to at least 59% functional to show improved function Baseline: 44 Goal status: On Going 10/28/2021 Pt will reduce pain by overall 50% overall with usual activity Baseline: Goal status: On Going 10/28/2021 Pt will be able to ambulate community distances at least 1000 ft WNL gait pattern without complaints or assistance Baseline: Goal status: Non Going using walker 10/28/2021   PLAN: PT FREQUENCY: 2-3 times per week    PT DURATION: 12 weeks   PLANNED INTERVENTIONS (unless contraindicated): aquatic PT, Canalith repositioning, cryotherapy, Electrical stimulation, Iontophoresis with 4 mg/ml dexamethasome, Moist heat, traction, Ultrasound, gait training, Therapeutic exercise, balance training, neuromuscular re-education, patient/family education, prosthetic training, manual techniques, passive ROM, dry needling, taping, vasopnuematic device, vestibular, spinal manipulations, joint manipulations   PLAN FOR NEXT SESSION:  held BFR due to DVT contraindication, Emphasis remains flexion AROM, quadriceps strength, progress gait with SPC as able.   Elsie Ra, PT, DPT 11/30/21 1:44 PM

## 2021-12-05 ENCOUNTER — Other Ambulatory Visit: Payer: Self-pay | Admitting: Physician Assistant

## 2021-12-05 ENCOUNTER — Encounter: Payer: Self-pay | Admitting: Physical Therapy

## 2021-12-05 ENCOUNTER — Ambulatory Visit (INDEPENDENT_AMBULATORY_CARE_PROVIDER_SITE_OTHER): Admitting: Physical Therapy

## 2021-12-05 DIAGNOSIS — R2689 Other abnormalities of gait and mobility: Secondary | ICD-10-CM

## 2021-12-05 DIAGNOSIS — M6281 Muscle weakness (generalized): Secondary | ICD-10-CM

## 2021-12-05 DIAGNOSIS — M25561 Pain in right knee: Secondary | ICD-10-CM

## 2021-12-05 DIAGNOSIS — M25661 Stiffness of right knee, not elsewhere classified: Secondary | ICD-10-CM | POA: Diagnosis not present

## 2021-12-05 DIAGNOSIS — R600 Localized edema: Secondary | ICD-10-CM | POA: Diagnosis not present

## 2021-12-05 MED ORDER — OXYCODONE-ACETAMINOPHEN 5-325 MG PO TABS: 1.0000 | ORAL_TABLET | Freq: Every day | ORAL | 0 refills | Status: DC | PRN

## 2021-12-05 NOTE — Telephone Encounter (Signed)
Sent in

## 2021-12-05 NOTE — Therapy (Signed)
OUTPATIENT PHYSICAL THERAPY TREATMENT NOTE   Patient Name: Sophia Long MRN: [id number] DOB:07/16/1980, 41 y.o., female Today's Date: 12/05/2021      END OF SESSION:   PT End of Session - 12/05/21 1343     Visit Number 14    Number of Visits 15    Date for PT Re-Evaluation 01/03/22    Authorization Type [employer]    PT Start Time 1344    PT Stop Time 8527    PT Time Calculation (min) 38 min    Activity Tolerance Patient tolerated treatment well;Patient limited by pain    Behavior During Therapy [facility] for tasks assessed/performed                 Past Medical History:  Diagnosis Date   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   ADHD (attention deficit hyperactivity disorder)    Asthma    Crohn's disease ([place]) 2000   DVT (deep venous thrombosis) ([place]) 2012   History of hiatal hernia 2018   Pulmonary embolism ([place]) 2012   Past Surgical History:  Procedure Laterality Date   ABDOMINAL HYSTERECTOMY  2009   partial   CESAREAN SECTION  2002   x 4 (2002, 2004, 2005, 2008)   CHOLECYSTECTOMY  2000   GASTRIC BYPASS  2019   HARDWARE REMOVAL Left 2015   knee   KNEE SURGERY Right 2012   KNEE SURGERY Left 2013   LAPAROSCOPIC GASTRIC SLEEVE RESECTION  2018   TENDON RECONSTRUCTION Right 2015   TOTAL KNEE ARTHROPLASTY Right 09/19/2021   Procedure: RIGHT TOTAL KNEE ARTHROPLASTY;  Surgeon: Leandrew Koyanagi, MD;  Location: [place];  Service: Orthopedics;  Laterality: Right;   TUBAL LIGATION  2005   Patient Active Problem List   Diagnosis Date Noted   Status post total right knee replacement 09/19/2021   Primary osteoarthritis of right knee 08/25/2021   Primary osteoarthritis of left knee 08/25/2021   Regional enteritis of large intestine ([place]) 08/15/2021   Other disorder of impulse control 08/15/2021   Asthma 08/15/2021   Hearing loss of left ear 08/15/2021   Encounter to establish care 08/15/2021   History of COVID-19 08/15/2021   Acute cough 08/15/2021    REFERRING PROVIDER: Leandrew Koyanagi, MD   REFERRING DIAG: [phone] (ICD-10-CM) - Hx of total knee replacement, right  ONSET DATE: 09/19/21  THERAPY DIAG:  Stiffness of right knee, not elsewhere classified  Other abnormalities of gait and mobility  Localized edema  Acute pain of right knee  Muscle weakness (generalized)  PERTINENT HISTORY: Rt TKA 09/19/21, DVT and PE 2012, 2 other previous knee surgeries patella realighmenta nd tendon reconstruction  PRECAUTIONS: None  SUBJECTIVE: She relays her Rt knee is very sore today and she did not sleep well last night. Says it is more soreness than pain  PAIN:  Are you having pain? Yes: NPRS scale:  2/10 today Pain location: Rt anterior knee Pain description: dull and achy but sharp if flexed too much Aggravating factors: static positioning, end range complaints.  Relieving factors: moving, ice, meds   OBJECTIVE:    PATIENT SURVEYS:  10/28/2021: FOTO 44 (Goal 59) 10/11/2021: FOTO 35% functional   MUSCLE LENGTH: 10/11/2021:  Tight H.S, and quads on Rt   PALPATION: 10/11/2021:  TTP and pain anterior knee  LE ROM:   AROM/PROM Right 10/11/2021 Right 10/13/2021  Right 10/20/21 Right 10/28/2021 Right 10/31/21 Right 11/10/21 Right 11/23/21  Hip flexion           Hip extension  Hip abduction           Hip adduction           Hip internal rotation           Hip external rotation           Knee flexion 75/80 Supine heel slide AROM: 74   Supine AA: 83 Active 85 Active 85 Passive 95 P: 100 P: 104    Knee extension 1/0  Seated AROM LAQ : -30 c pain  0 (Full extension) 0    Ankle dorsiflexion           Ankle plantarflexion           Ankle inversion           Ankle eversion            (Blank rows = not tested)   LE MMT:   MMT in sitting Right 10/11/2021 Right 10/31/21 Right 11/10/21  Hip flexion 3/5    Hip extension      Hip abduction 3/5    Hip adduction      Hip internal rotation      Hip external rotation      Knee flexion 3/5 4/5 4/5  Knee extension  2/5 3/5 3+/5  Ankle dorsiflexion      Ankle plantarflexion      Ankle inversion      Ankle eversion       (Blank rows = not tested)   FUNCTIONAL TESTS:      GAIT: 10/11/2021:   Distance walked: 75 Assistive device utilized: Environmental consultant - 2 wheeled Level of assistance: supervision Comments: RW too short but is at its tallest level it can be adjusted, she has decreased hip/knee flexion and weight    shift to Rt  11/23/21 Gait with SPC 100 feet with supervision, gait at counter top for fwd and retro walking with SPC and hand hovering over counter top 5 round trips.  11/30/21 Gait with SPC 150 feet with supervision    12/05/21 Gait with SPC 150 feet with supervision   TODAY'S TREATMENT: 12/05/2021 Therapeutic Exercise: Recumbent bike seat #6 rocking then progressed to full revolutions in 1.5 minutes, 10 min total Gait see details above Leg Press R leg only 31# X 20 . Slow eccentrics, then DL 87# 2X10 Step ups fwd and lateral for Rt leg 4 inch X 10 each using one UE support Knee flexion AAROM (L pushes R into flexion) 10 seconds X 10 reps Seated knee extension machine 5# 2X10 with eccentrics (up both and down Rt only) and stretching into knee flexion between and after sets 1 minute Seated knee flexion machine 25# DL 2X10 TRX squats X 10 Supine SLR 2X10 Supine quad stretch with strap leg off EOB 3X30 sec on Rt  Manual therapy Rt knee PROM into flexion and flexion mobs grade 2-3 in supine, IASTM to quads  11/30/2021 Therapeutic Exercise: Recumbent bike rocking then progressed to full revolutions retro and eventually full revolutions fwd, 10 min total Gait see details above Leg Press R leg only 31# X 20 . Slow eccentrics, then DL 87# 2X10 Step ups fwd and lateral for Rt leg 4 inch X 10 each using one UE support Knee flexion AAROM (L pushes R into flexion) 10 seconds X 10 reps Seated LAQ bilat reciprocally for 2 min with 3# Sit to stands no UE support with slow eccentric lower Supine  SLR 2X10  Manual therapy Rt knee PROM into flexion and  flexion mobs grade 2-3 in supine      PATIENT EDUCATION:  10/11/2021 Education details: HEP,POC, Person educated: Patient and Spouse Education method: Explanation, Demonstration, Verbal cues, and Handouts Education comprehension: verbalized understanding, returned demonstration, and needs further education     HOME EXERCISE PROGRAM: 10/11/2021 Access Code: [email or web address] URL: [email or web address] Date: 10/11/2021 Prepared by: Elsie Ra   Exercises - Supine Heel Slide with Strap  - 2 x daily - 6 x weekly - 1-2 sets - 10 reps - 5 hold - Supine Quadricep Sets  - 2 x daily - 6 x weekly - 1-2 sets - 10 reps - 5 sec hold - Seated Long Arc Quad  - 2 x daily - 6 x weekly - 2-3 sets - 10 reps - Seated Knee Flexion Stretch  - 2 x daily - 6 x weekly - 1-2 sets - 10 reps - 5 sec hold - Seated Hamstring Stretch  - 2 x daily - 6 x weekly - 1 sets - 2-3 reps - 30 hold               ASSESSMENT:   CLINICAL IMPRESSION: She had fair overall tolerance to exercise progression today but was limited by more overall soreness. Her ROM continues to slowly but steadily improve. I did add IASTM to quads to help with tightness and soreness.  She will need recert next visit.    OBJECTIVE IMPAIRMENTS: decreased activity tolerance, difficulty walking, decreased balance, decreased endurance, decreased mobility, decreased ROM, decreased strength, impaired flexibility, impaired sLE use, postural dysfunction, and pain.   ACTIVITY LIMITATIONS: bending, lifting, carry, locomotion, cleaning, community activity, driving, and or occupation   PERSONAL FACTORS: S/P RIGHT TKA-09/19/2021, DVT and PE 2012, 2 other previous knee surgeries patella realignment and tendon reconstruction are also affecting patient's functional outcome.   REHAB POTENTIAL: good   CLINICAL DECISION MAKING: Stable/uncomplicated   EVALUATION COMPLEXITY: Low   GOALS: Short term  PT Goals Target date: 11/08/2021 Pt will be I and compliant with HEP. Baseline:  Goal status: Met 10/19/2021 Pt will decrease pain by 25% overall Baseline: Goal status: MET 11/30/21   Long term PT goals Target date: 01/03/2022 Pt will improve Rt knee AROM to 0-100 degrees to improve functional mobility Baseline: Goal status: 0 - 85 10/28/2021 Pt will improve  hip/knee strength to at least 5-/5 MMT to improve functional strength Baseline: Goal status: On Going 10/28/2021 Pt will improve FOTO to at least 59% functional to show improved function Baseline: 44 Goal status: On Going 10/28/2021 Pt will reduce pain by overall 50% overall with usual activity Baseline: Goal status: On Going 10/28/2021 Pt will be able to ambulate community distances at least 1000 ft WNL gait pattern without complaints or assistance Baseline: Goal status: Non Going using walker 10/28/2021   PLAN: PT FREQUENCY: 2-3 times per week    PT DURATION: 12 weeks   PLANNED INTERVENTIONS (unless contraindicated): aquatic PT, Canalith repositioning, cryotherapy, Electrical stimulation, Iontophoresis with 4 mg/ml dexamethasome, Moist heat, traction, Ultrasound, gait training, Therapeutic exercise, balance training, neuromuscular re-education, patient/family education, prosthetic training, manual techniques, passive ROM, dry needling, taping, vasopnuematic device, vestibular, spinal manipulations, joint manipulations   PLAN FOR NEXT SESSION:  hold BFR due to DVT contraindication, Emphasis remains flexion AROM, quadriceps strength, progress gait with SPC as able.   Elsie Ra, PT, DPT 12/05/21 2:24 PM

## 2021-12-06 ENCOUNTER — Encounter: Payer: Self-pay | Admitting: Orthopaedic Surgery

## 2021-12-06 NOTE — Telephone Encounter (Signed)
yes

## 2021-12-07 ENCOUNTER — Other Ambulatory Visit: Payer: Self-pay | Admitting: Family Medicine

## 2021-12-07 DIAGNOSIS — F900 Attention-deficit hyperactivity disorder, predominantly inattentive type: Secondary | ICD-10-CM

## 2021-12-07 MED ORDER — AMPHETAMINE-DEXTROAMPHETAMINE 30 MG PO TABS: 30.0000 mg | ORAL_TABLET | Freq: Every day | ORAL | 0 refills | Status: DC

## 2021-12-12 ENCOUNTER — Telehealth: Payer: Self-pay | Admitting: Physical Therapy

## 2021-12-12 ENCOUNTER — Encounter: Admitting: Physical Therapy

## 2021-12-12 NOTE — Telephone Encounter (Signed)
Pt did not show for PT appointment today. They were contacted and informed of this via voicemail. They were provided the date and time of their next appointment on voicemail. They were instructed to call us to let us know if they cannot make their appointment.   Ivery Quale, PT, DPT 12/12/21 2:07 PM

## 2021-12-14 ENCOUNTER — Ambulatory Visit (INDEPENDENT_AMBULATORY_CARE_PROVIDER_SITE_OTHER): Admitting: Orthopaedic Surgery

## 2021-12-14 ENCOUNTER — Ambulatory Visit (INDEPENDENT_AMBULATORY_CARE_PROVIDER_SITE_OTHER)

## 2021-12-14 DIAGNOSIS — Z96651 Presence of right artificial knee joint: Secondary | ICD-10-CM

## 2021-12-14 NOTE — Progress Notes (Signed)
Post-Op Visit Note   Patient: Sophia Long           Date of Birth: [birth year]           MRN: [id number] Visit Date: 12/14/2021 PCP: Dorna Mai, MD   Assessment & Plan:  Chief Complaint:  Chief Complaint  Patient presents with   Right Knee - Routine Post Op    10/09/21 right TKA   Visit Diagnoses:  1. Hx of total knee replacement, right     Plan: Brecklynn is 3 months status post right total knee replacement on 09/19/2021.  She is making progress at PT.  Swelling is improved.  Overall she is improved.  Examination right knee shows a fully healed surgical scar.  Range of motion 0 to 140 degrees.  Stable to varus valgus stress.  No signs of infection.  Mild soft tissue edema.  Jhania is making steady progress from her knee replacement.  I am happy with how she is doing.  She will continue to work with her physical therapist on weaning off of the walker.  This is mainly mental more than a physical issue.  We will see her back in 3 months with two-view x-rays of the right knee.  Follow-Up Instructions: Return in about 3 months (around 03/16/2022).   Orders:  Orders Placed This Encounter  Procedures   XR Knee 1-2 Views Right   No orders of the defined types were placed in this encounter.   Imaging: XR Knee 1-2 Views Right  Result Date: 12/14/2021 Stable total knee replacement in good alignment    PMFS History: Patient Active Problem List   Diagnosis Date Noted   Status post total right knee replacement 09/19/2021   Primary osteoarthritis of right knee 08/25/2021   Primary osteoarthritis of left knee 08/25/2021   Regional enteritis of large intestine ([place]) 08/15/2021   Other disorder of impulse control 08/15/2021   Asthma 08/15/2021   Hearing loss of left ear 08/15/2021   Encounter to establish care 08/15/2021   History of COVID-19 08/15/2021   Acute cough 08/15/2021   Past Medical History:  Diagnosis Date   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   ADHD (attention deficit  hyperactivity disorder)    Asthma    Crohn's disease ([place]) 2000   DVT (deep venous thrombosis) ([place]) 2012   History of hiatal hernia 2018   Pulmonary embolism ([place]) 2012    Family History  Problem Relation Age of Onset   Hypertension Mother    Diabetes Father    Colon cancer Maternal Uncle    Stomach cancer Maternal Uncle     Past Surgical History:  Procedure Laterality Date   ABDOMINAL HYSTERECTOMY  2009   partial   CESAREAN SECTION  2002   x 4 (2002, 2004, 2005, 2008)   CHOLECYSTECTOMY  2000   GASTRIC BYPASS  2019   HARDWARE REMOVAL Left 2015   knee   KNEE SURGERY Right 2012   KNEE SURGERY Left 2013   LAPAROSCOPIC GASTRIC SLEEVE RESECTION  2018   TENDON RECONSTRUCTION Right 2015   TOTAL KNEE ARTHROPLASTY Right 09/19/2021   Procedure: RIGHT TOTAL KNEE ARTHROPLASTY;  Surgeon: Leandrew Koyanagi, MD;  Location: [place];  Service: Orthopedics;  Laterality: Right;   TUBAL LIGATION  2005   Social History   Occupational History   Not on file  Tobacco Use   Smoking status: Every Day    Packs/day: 0.20    Types: Cigarettes   Smokeless tobacco: Never  Vaping Use  Vaping Use: Never used  Substance and Sexual Activity   Alcohol use: Not Currently   Drug use: Never   Sexual activity: Yes

## 2021-12-16 ENCOUNTER — Encounter: Admitting: Physical Therapy

## 2021-12-19 ENCOUNTER — Ambulatory Visit (INDEPENDENT_AMBULATORY_CARE_PROVIDER_SITE_OTHER): Admitting: Physical Therapy

## 2021-12-19 ENCOUNTER — Encounter: Payer: Self-pay | Admitting: Physical Therapy

## 2021-12-19 ENCOUNTER — Encounter: Payer: Self-pay | Admitting: Orthopaedic Surgery

## 2021-12-19 DIAGNOSIS — M25561 Pain in right knee: Secondary | ICD-10-CM

## 2021-12-19 DIAGNOSIS — M25661 Stiffness of right knee, not elsewhere classified: Secondary | ICD-10-CM | POA: Diagnosis not present

## 2021-12-19 DIAGNOSIS — R2689 Other abnormalities of gait and mobility: Secondary | ICD-10-CM

## 2021-12-19 DIAGNOSIS — R600 Localized edema: Secondary | ICD-10-CM | POA: Diagnosis not present

## 2021-12-19 DIAGNOSIS — M6281 Muscle weakness (generalized): Secondary | ICD-10-CM

## 2021-12-19 NOTE — Therapy (Signed)
OUTPATIENT PHYSICAL THERAPY TREATMENT NOTE/Recert   Patient Name: Sophia Long MRN: [id number] DOB:September 12, 1980, 41 y.o., female Today's Date: 12/19/2021      END OF SESSION:   PT End of Session - 12/19/21 1448     Visit Number 15    Number of Visits 23    Date for PT Re-Evaluation 01/16/22    Authorization Type [employer]    PT Start Time 6948    PT Stop Time 1520    PT Time Calculation (min) 39 min    Activity Tolerance Patient tolerated treatment well;Patient limited by pain    Behavior During Therapy [facility] for tasks assessed/performed                 Past Medical History:  Diagnosis Date   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   ADHD (attention deficit hyperactivity disorder)    Asthma    Crohn's disease ([place]) 2000   DVT (deep venous thrombosis) ([place]) 2012   History of hiatal hernia 2018   Pulmonary embolism ([place]) 2012   Past Surgical History:  Procedure Laterality Date   ABDOMINAL HYSTERECTOMY  2009   partial   CESAREAN SECTION  2002   x 4 (2002, 2004, 2005, 2008)   CHOLECYSTECTOMY  2000   GASTRIC BYPASS  2019   HARDWARE REMOVAL Left 2015   knee   KNEE SURGERY Right 2012   KNEE SURGERY Left 2013   LAPAROSCOPIC GASTRIC SLEEVE RESECTION  2018   TENDON RECONSTRUCTION Right 2015   TOTAL KNEE ARTHROPLASTY Right 09/19/2021   Procedure: RIGHT TOTAL KNEE ARTHROPLASTY;  Surgeon: Leandrew Koyanagi, MD;  Location: [place];  Service: Orthopedics;  Laterality: Right;   TUBAL LIGATION  2005   Patient Active Problem List   Diagnosis Date Noted   Status post total right knee replacement 09/19/2021   Primary osteoarthritis of right knee 08/25/2021   Primary osteoarthritis of left knee 08/25/2021   Regional enteritis of large intestine ([place]) 08/15/2021   Other disorder of impulse control 08/15/2021   Asthma 08/15/2021   Hearing loss of left ear 08/15/2021   Encounter to establish care 08/15/2021   History of COVID-19 08/15/2021   Acute cough 08/15/2021    REFERRING PROVIDER: Leandrew Koyanagi, MD   REFERRING DIAG: [phone] (ICD-10-CM) - Hx of total knee replacement, right  ONSET DATE: 09/19/21  THERAPY DIAG:  Stiffness of right knee, not elsewhere classified  Other abnormalities of gait and mobility  Localized edema  Acute pain of right knee  Muscle weakness (generalized)  PERTINENT HISTORY: Rt TKA 09/19/21, DVT and PE 2012, 2 other previous knee surgeries patella realighmenta nd tendon reconstruction  PRECAUTIONS: None  SUBJECTIVE: She relays she needs to be able to walk without AD to return to work  PAIN:  Are you having pain? Yes: NPRS scale:  2-3/10 today Pain location: Rt anterior knee Pain description: dull and achy but sharp if flexed too much Aggravating factors: static positioning, end range complaints.  Relieving factors: moving, ice, meds   OBJECTIVE:    PATIENT SURVEYS:  10/11/2021: FOTO 35% functional 10/28/2021: FOTO 44 (Goal 59) 12/19/21: FOTO 49% functional (goal 59)   MUSCLE LENGTH: 10/11/2021:  Tight H.S, and quads on Rt   PALPATION: 10/11/2021:  TTP and pain anterior knee  LE ROM:   AROM/PROM Right 10/11/2021 Right 10/13/2021  Right 10/20/21 Right 10/28/2021 Right 10/31/21 Right 11/10/21 Right 11/23/21 Right 12/19/21  Hip flexion            Hip extension  Hip abduction            Hip adduction            Hip internal rotation            Hip external rotation            Knee flexion 75/80 Supine heel slide AROM: 74   Supine AA: 83 Active 85 Active 85 Passive 95 P: 100 P: 104   A:105 P:112  Knee extension 1/0  Seated AROM LAQ : -30 c pain  0 (Full extension) 0   0  Ankle dorsiflexion            Ankle plantarflexion            Ankle inversion            Ankle eversion             (Blank rows = not tested)   LE MMT:   MMT in sitting Right 10/11/2021 Right 10/31/21 Right 11/10/21 Right 12/19/21  Hip flexion 3/5     Hip extension       Hip abduction 3/5     Hip adduction       Hip internal rotation       Hip  external rotation       Knee flexion 3/5 4/5 4/5 4  Knee extension 2/5 3/5 3+/5 4  Ankle dorsiflexion       Ankle plantarflexion       Ankle inversion       Ankle eversion        (Blank rows = not tested)   FUNCTIONAL TESTS:      GAIT: 10/11/2021:   Distance walked: 75 Assistive device utilized: Environmental consultant - 2 wheeled Level of assistance: supervision Comments: RW too short but is at its tallest level it can be adjusted, she has decreased hip/knee flexion and weight    shift to Rt  11/23/21 Gait with SPC 100 feet with supervision, gait at counter top for fwd and retro walking with SPC and hand hovering over counter top 5 round trips. 11/30/21 Gait with SPC 150 feet with supervision   12/05/21 Gait with SPC 150 feet with supervision 12/19/21 Gait without SPC 25 feet X 2 with close supervision, gait with SPC 150 feet mod I    TODAY'S TREATMENT: 12/19/2021 Therapeutic Exercise: Recumbent bike seat #5 full revolutions 9 min total Gait see details above Leg Press R leg only 37# 3X10 then DL 93# 2X10 Step ups fwd and lateral for Rt leg 6 inch X 10 each using one UE support March walking, tandem walking, and retrowalking at counter 3 round trips with light to no UE support Sit to stands slow eccentrics X10 no UE support Supine quad stretch with strap leg off EOB 3X30 sec on Rt  Modalaties: Vasopnuematic X 10 min to Rt knee medium compression lowest temp X 5 min  12/05/2021 Therapeutic Exercise: Recumbent bike seat #6 rocking then progressed to full revolutions in 1.5 minutes, 10 min total Gait see details above Leg Press R leg only 31# X 20 . Slow eccentrics, then DL 87# 2X10 Step ups fwd and lateral for Rt leg 4 inch X 10 each using one UE support Knee flexion AAROM (L pushes R into flexion) 10 seconds X 10 reps Seated knee extension machine 5# 2X10 with eccentrics (up both and down Rt only) and stretching into knee flexion between and after sets 1 minute Seated knee flexion machine 25#  DL 2X10 TRX squats X 10 Supine SLR 2X10 Supine quad stretch with strap leg off EOB 3X30 sec on Rt  Manual therapy Rt knee PROM into flexion and flexion mobs grade 2-3 in supine, IASTM to quads      PATIENT EDUCATION:  10/11/2021 Education details: HEP,POC, Person educated: Patient and Spouse Education method: Explanation, Demonstration, Verbal cues, and Handouts Education comprehension: verbalized understanding, returned demonstration, and needs further education     HOME EXERCISE PROGRAM: 10/11/2021 Access Code: [email or web address] URL: [email or web address] Date: 10/11/2021 Prepared by: Elsie Ra   Exercises - Supine Heel Slide with Strap  - 2 x daily - 6 x weekly - 1-2 sets - 10 reps - 5 hold - Supine Quadricep Sets  - 2 x daily - 6 x weekly - 1-2 sets - 10 reps - 5 sec hold - Seated Long Arc Quad  - 2 x daily - 6 x weekly - 2-3 sets - 10 reps - Seated Knee Flexion Stretch  - 2 x daily - 6 x weekly - 1-2 sets - 10 reps - 5 sec hold - Seated Hamstring Stretch  - 2 x daily - 6 x weekly - 1 sets - 2-3 reps - 30 hold               ASSESSMENT:   CLINICAL IMPRESSION: Recert today as her PT visit plan of care was at then end 15 of initial 15. She has made progress however still has not yet met all her long term PT goals and still requires [facility] for ambulation. PT is recommending 4 more weeks of PT to address her continued functional impairments listed below.  OBJECTIVE IMPAIRMENTS: decreased activity tolerance, difficulty walking, decreased balance, decreased endurance, decreased mobility, decreased ROM, decreased strength, impaired flexibility, impaired LE use, postural dysfunction, and pain.   ACTIVITY LIMITATIONS: bending, lifting, carry, locomotion, cleaning, community activity, driving, and or occupation   PERSONAL FACTORS: S/P RIGHT TKA-09/19/2021, DVT and PE 2012, 2 other previous knee surgeries patella realignment and tendon reconstruction are also affecting patient's  functional outcome.   REHAB POTENTIAL: good   CLINICAL DECISION MAKING: Stable/uncomplicated   EVALUATION COMPLEXITY: Low   GOALS: Short term PT Goals Target date: 11/08/2021 Pt will be I and compliant with HEP. Baseline:  Goal status: Met 10/19/2021 Pt will decrease pain by 25% overall Baseline: Goal status: MET 11/30/21   Long term PT goals Target date: 01/16/2022 Pt will improve Rt knee AROM to 0-100 degrees to improve functional mobility Baseline: Goal status: 0 - 85 10/28/2021 Pt will improve  hip/knee strength to at least 5-/5 MMT to improve functional strength Baseline: Goal status: On Going 10/28/2021 Pt will improve FOTO to at least 59% functional to show improved function Baseline: 44 Goal status: On Going 10/28/2021 Pt will reduce pain by overall 50% overall with usual activity Baseline: Goal status: On Going 10/28/2021 Pt will be able to ambulate community distances at least 1000 ft WNL gait pattern without complaints or assistance Baseline: Goal status: Non Going using walker 10/28/2021   PLAN: PT FREQUENCY: 2-3 times per week    PT DURATION: 12 weeks   PLANNED INTERVENTIONS (unless contraindicated): aquatic PT, Canalith repositioning, cryotherapy, Electrical stimulation, Iontophoresis with 4 mg/ml dexamethasome, Moist heat, traction, Ultrasound, gait training, Therapeutic exercise, balance training, neuromuscular re-education, patient/family education, prosthetic training, manual techniques, passive ROM, dry needling, taping, vasopnuematic device, vestibular, spinal manipulations, joint manipulations   PLAN FOR NEXT SESSION:  hold BFR due to DVT contraindication, Emphasis remains flexion AROM,  quadriceps strength, progress gait without AD as able   Elsie Ra, PT, DPT 12/19/21 3:47 PM

## 2021-12-21 ENCOUNTER — Ambulatory Visit (INDEPENDENT_AMBULATORY_CARE_PROVIDER_SITE_OTHER): Admitting: Physical Therapy

## 2021-12-21 ENCOUNTER — Other Ambulatory Visit: Payer: Self-pay | Admitting: Physician Assistant

## 2021-12-21 ENCOUNTER — Encounter: Payer: Self-pay | Admitting: Orthopaedic Surgery

## 2021-12-21 ENCOUNTER — Encounter: Payer: Self-pay | Admitting: Physical Therapy

## 2021-12-21 DIAGNOSIS — R2689 Other abnormalities of gait and mobility: Secondary | ICD-10-CM

## 2021-12-21 DIAGNOSIS — M25661 Stiffness of right knee, not elsewhere classified: Secondary | ICD-10-CM | POA: Diagnosis not present

## 2021-12-21 DIAGNOSIS — M6281 Muscle weakness (generalized): Secondary | ICD-10-CM

## 2021-12-21 DIAGNOSIS — R600 Localized edema: Secondary | ICD-10-CM

## 2021-12-21 DIAGNOSIS — M25561 Pain in right knee: Secondary | ICD-10-CM

## 2021-12-21 MED ORDER — HYDROCODONE-ACETAMINOPHEN 5-325 MG PO TABS: 1.0000 | ORAL_TABLET | Freq: Every day | ORAL | 0 refills | Status: DC | PRN

## 2021-12-21 NOTE — Therapy (Signed)
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   ADHD (attention deficit hyperactivity disorder)    Asthma    Crohn's disease ([place]) 2000   DVT (deep venous thrombosis) ([place]) 2012   History of hiatal hernia 2018   Pulmonary embolism ([place]) 2012   Past Surgical History:  Procedure Laterality Date   ABDOMINAL HYSTERECTOMY  2009   partial   CESAREAN SECTION  2002   x 4 (2002, 2004, 2005, 2008)   CHOLECYSTECTOMY  2000   GASTRIC BYPASS  2019   HARDWARE REMOVAL Left 2015   knee   KNEE SURGERY Right 2012   KNEE SURGERY Left 2013   LAPAROSCOPIC GASTRIC SLEEVE RESECTION  2018   TENDON RECONSTRUCTION Right 2015   TOTAL KNEE ARTHROPLASTY Right 09/19/2021   Procedure: RIGHT TOTAL KNEE ARTHROPLASTY;  Surgeon: Leandrew Koyanagi, MD;  Location: [place];  Service: Orthopedics;  Laterality: Right;   TUBAL LIGATION  2005   Patient Active Problem List   Diagnosis Date Noted   Status post total right knee replacement 09/19/2021   Primary osteoarthritis of right knee 08/25/2021   Primary osteoarthritis of left knee 08/25/2021   Regional enteritis of large intestine ([place]) 08/15/2021   Other disorder of impulse control 08/15/2021   Asthma 08/15/2021   Hearing loss of left ear 08/15/2021   Encounter to establish care 08/15/2021   History of COVID-19 08/15/2021   Acute cough 08/15/2021    REFERRING PROVIDER: Leandrew Koyanagi, MD   REFERRING  DIAG: [phone] (ICD-10-CM) - Hx of total knee replacement, right  ONSET DATE: 09/19/21  THERAPY DIAG:  Stiffness of right knee, not elsewhere classified  Other abnormalities of gait and mobility  Localized edema  Acute pain of right knee  Muscle weakness (generalized)  PERTINENT HISTORY: Rt TKA 09/19/21, DVT and PE 2012, 2 other previous knee surgeries patella realighmenta nd tendon reconstruction  PRECAUTIONS: None  SUBJECTIVE: She relays she is now mostly walking without SPC in house but still needs SPC in community  PAIN:  Are you having pain? Yes: NPRS scale:  3/10 today Pain location: Rt anterior knee Pain description: dull and achy but sharp if flexed too much Aggravating factors: static positioning, end range complaints.  Relieving factors: moving, ice, meds   OBJECTIVE:    PATIENT SURVEYS:  10/11/2021: FOTO 35% functional 10/28/2021: FOTO 44 (Goal 59) 12/19/21: FOTO 49% functional (goal 59)   MUSCLE LENGTH: 10/11/2021:  Tight H.S, and quads on Rt   PALPATION: 10/11/2021:  TTP and pain anterior knee  LE ROM:   AROM/PROM Right 10/11/2021 Right 10/13/2021  Right 10/20/21 Right 10/28/2021 Right 10/31/21 Right 11/10/21 Right 11/23/21 Right 12/19/21  Hip flexion            Hip extension  Hip abduction            Hip adduction            Hip internal rotation            Hip external rotation            Knee flexion 75/80 Supine heel slide AROM: 74   Supine AA: 83 Active 85 Active 85 Passive 95 P: 100 P: 104   A:105 P:112  Knee extension 1/0  Seated AROM LAQ : -30 c pain  0 (Full extension) 0   0  Ankle dorsiflexion            Ankle plantarflexion            Ankle inversion            Ankle eversion             (Blank rows = not tested)   LE MMT:   MMT in sitting Right 10/11/2021 Right 10/31/21 Right 11/10/21 Right 12/19/21  Hip flexion 3/5     Hip extension       Hip abduction 3/5     Hip adduction       Hip internal rotation       Hip external  rotation       Knee flexion 3/5 4/5 4/5 4  Knee extension 2/5 3/5 3+/5 4  Ankle dorsiflexion       Ankle plantarflexion       Ankle inversion       Ankle eversion        (Blank rows = not tested)   FUNCTIONAL TESTS:      GAIT: 10/11/2021:   Distance walked: 75 Assistive device utilized: Environmental consultant - 2 wheeled Level of assistance: supervision Comments: RW too short but is at its tallest level it can be adjusted, she has decreased hip/knee flexion and weight    shift to Rt  11/23/21 Gait with SPC 100 feet with supervision, gait at counter top for fwd and retro walking with SPC and hand hovering over counter top 5 round trips. 11/30/21 Gait with SPC 150 feet with supervision   12/05/21 Gait with SPC 150 feet with supervision 12/19/21 Gait without SPC 25 feet X 2 with close supervision, gait with SPC 150 feet mod I 12/21/21 Gait without SPC 50 feet X 1 with close supervision, gait with SPC 150 feet mod I    TODAY'S TREATMENT: 12/19/2021 Therapeutic Exercise: Recumbent bike seat #5 L3 full revolutions 8 min total Gait see details above Leg Press R leg only 37# 3X10 then DL 93# 2X10 Step ups fwd and lateral for Rt leg 6 inch X 10 each using one UE support March walking, tandem walking, and retrowalking at counter 3 round trips with light to no UE support Sit to stands slow eccentrics X10 no UE support Leg extension machine 5# eccentrics up with both and down with Rt only 2X10 Hamstring curl machine 25# 2X15 bilat  12/05/2021 Therapeutic Exercise: Recumbent bike seat #6 rocking then progressed to full revolutions in 1.5 minutes, 10 min total Gait see details above Leg Press R leg only 31# X 20 . Slow eccentrics, then DL 87# 2X10 Step ups fwd and lateral for Rt leg 4 inch X 10 each using one UE support Knee flexion AAROM (L pushes R into flexion) 10 seconds X 10 reps Seated knee extension machine 5# 2X10 with eccentrics (up both and down Rt only) and stretching into knee flexion  between and  after sets 1 minute Seated knee flexion machine 25# DL 2X10 TRX squats X 10 Supine SLR 2X10 Supine quad stretch with strap leg off EOB 3X30 sec on Rt  Manual therapy Rt knee PROM into flexion and flexion mobs grade 2-3 in supine, IASTM to quads      PATIENT EDUCATION:  10/11/2021 Education details: HEP,POC, Person educated: Patient and Spouse Education method: Explanation, Demonstration, Verbal cues, and Handouts Education comprehension: verbalized understanding, returned demonstration, and needs further education     HOME EXERCISE PROGRAM: 10/11/2021 Access Code: [email or web address] URL: [email or web address] Date: 10/11/2021 Prepared by: Elsie Ra   Exercises - Supine Heel Slide with Strap  - 2 x daily - 6 x weekly - 1-2 sets - 10 reps - 5 hold - Supine Quadricep Sets  - 2 x daily - 6 x weekly - 1-2 sets - 10 reps - 5 sec hold - Seated Long Arc Quad  - 2 x daily - 6 x weekly - 2-3 sets - 10 reps - Seated Knee Flexion Stretch  - 2 x daily - 6 x weekly - 1-2 sets - 10 reps - 5 sec hold - Seated Hamstring Stretch  - 2 x daily - 6 x weekly - 1 sets - 2-3 reps - 30 hold               ASSESSMENT:   CLINICAL IMPRESSION: She is showing progress however this has been overall slow as expected. She is weaning more from the [facility] to independent ambulation for short distances as able. She will continue to benefit from PT for more strength, ROM, and gait progressions as tolerated.  OBJECTIVE IMPAIRMENTS: decreased activity tolerance, difficulty walking, decreased balance, decreased endurance, decreased mobility, decreased ROM, decreased strength, impaired flexibility, impaired LE use, postural dysfunction, and pain.   ACTIVITY LIMITATIONS: bending, lifting, carry, locomotion, cleaning, community activity, driving, and or occupation   PERSONAL FACTORS: S/P RIGHT TKA-09/19/2021, DVT and PE 2012, 2 other previous knee surgeries patella realignment and tendon reconstruction are also  affecting patient's functional outcome.   REHAB POTENTIAL: good   CLINICAL DECISION MAKING: Stable/uncomplicated   EVALUATION COMPLEXITY: Low   GOALS: Short term PT Goals Target date: 11/08/2021 Pt will be I and compliant with HEP. Baseline:  Goal status: Met 10/19/2021 Pt will decrease pain by 25% overall Baseline: Goal status: MET 11/30/21   Long term PT goals Target date: 01/16/2022 Pt will improve Rt knee AROM to 0-100 degrees to improve functional mobility Baseline: Goal status: MET 12/19/21 Pt will improve  hip/knee strength to at least 5-/5 MMT to improve functional strength Baseline: Goal status: On Going 12/21/21 Pt will improve FOTO to at least 59% functional to show improved function Baseline: 44 Goal status: On Going 10/28/2021 Pt will reduce pain by overall 50% overall with usual activity Baseline: Goal status: On Going 12/21/21 Pt will be able to ambulate community distances at least 1000 ft WNL gait pattern without complaints or assistance Baseline: Goal status: ongoing, now using cane for this, 12/21/21   PLAN: PT FREQUENCY: 2-3 times per week    PT DURATION: 12 weeks   PLANNED INTERVENTIONS (unless contraindicated): aquatic PT, Canalith repositioning, cryotherapy, Electrical stimulation, Iontophoresis with 4 mg/ml dexamethasome, Moist heat, traction, Ultrasound, gait training, Therapeutic exercise, balance training, neuromuscular re-education, patient/family education, prosthetic training, manual techniques, passive ROM, dry needling, taping, vasopnuematic device, vestibular, spinal manipulations, joint manipulations   PLAN FOR NEXT SESSION:  hold BFR due to DVT contraindication, Emphasis remains flexion AROM,  quadriceps strength, progress gait without AD as able   Elsie Ra, PT, DPT 12/21/21 3:12 PM

## 2021-12-21 NOTE — Telephone Encounter (Signed)
We cannot do percocet, but I have sent in one more narcotic rx which is norco

## 2021-12-23 ENCOUNTER — Other Ambulatory Visit: Payer: Self-pay | Admitting: Physician Assistant

## 2021-12-23 MED ORDER — TRAMADOL HCL 50 MG PO TABS: 50.0000 mg | ORAL_TABLET | Freq: Two times a day (BID) | ORAL | 0 refills | Status: DC | PRN

## 2021-12-23 NOTE — Telephone Encounter (Signed)
Sent in tramadol

## 2021-12-23 NOTE — Telephone Encounter (Signed)
What kind of stomach issues does she get from norco?

## 2021-12-27 ENCOUNTER — Encounter: Payer: Self-pay | Admitting: Physical Therapy

## 2021-12-27 ENCOUNTER — Ambulatory Visit (INDEPENDENT_AMBULATORY_CARE_PROVIDER_SITE_OTHER): Admitting: Physical Therapy

## 2021-12-27 DIAGNOSIS — M25661 Stiffness of right knee, not elsewhere classified: Secondary | ICD-10-CM

## 2021-12-27 DIAGNOSIS — R2689 Other abnormalities of gait and mobility: Secondary | ICD-10-CM

## 2021-12-27 DIAGNOSIS — R600 Localized edema: Secondary | ICD-10-CM

## 2021-12-27 DIAGNOSIS — M25561 Pain in right knee: Secondary | ICD-10-CM

## 2021-12-27 DIAGNOSIS — M6281 Muscle weakness (generalized): Secondary | ICD-10-CM

## 2021-12-27 NOTE — Therapy (Addendum)
OUTPATIENT PHYSICAL THERAPY TREATMENT NOTE  PHYSICAL THERAPY DISCHARGE SUMMARY  Visits from Start of Care: 17  Current functional level related to goals / functional outcomes: See below   Remaining deficits: See below   Education / Equipment: HEP  Plan:  Patient goals were partially met. Patient is being discharged due to not returning since last visit. 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   ADHD (attention deficit hyperactivity disorder)    Asthma    Crohn's disease ([place]) 2000   DVT (deep venous thrombosis) ([place]) 2012   History of hiatal hernia 2018   Pulmonary embolism ([place]) 2012   Past Surgical History:  Procedure Laterality Date   ABDOMINAL HYSTERECTOMY  2009   partial   CESAREAN SECTION  2002   x 4 (2002, 2004, 2005, 2008)   CHOLECYSTECTOMY  2000   GASTRIC BYPASS  2019   HARDWARE REMOVAL Left 2015   knee   KNEE SURGERY Right 2012   KNEE SURGERY Left 2013   LAPAROSCOPIC GASTRIC SLEEVE RESECTION  2018   TENDON RECONSTRUCTION Right 2015   TOTAL KNEE ARTHROPLASTY Right 09/19/2021   Procedure: RIGHT TOTAL KNEE ARTHROPLASTY;  Surgeon: Leandrew Koyanagi, MD;  Location: [place];  Service: Orthopedics;  Laterality: Right;   TUBAL LIGATION  2005   Patient Active Problem List   Diagnosis Date Noted   Status post total right knee replacement 09/19/2021   Primary osteoarthritis of right knee 08/25/2021    Primary osteoarthritis of left knee 08/25/2021   Regional enteritis of large intestine ([place]) 08/15/2021   Other disorder of impulse control 08/15/2021   Asthma 08/15/2021   Hearing loss of left ear 08/15/2021   Encounter to establish care 08/15/2021   History of COVID-19 08/15/2021   Acute cough 08/15/2021    REFERRING PROVIDER: Leandrew Koyanagi, MD   REFERRING DIAG: [phone] (ICD-10-CM) - Hx of total knee replacement, right  ONSET DATE: 09/19/21  THERAPY DIAG:  Stiffness of right knee, not elsewhere classified  Other abnormalities of gait and mobility  Localized edema  Acute pain of right knee  Muscle weakness (generalized)  PERTINENT HISTORY: Rt TKA 09/19/21, DVT and PE 2012, 2 other previous knee surgeries patella realighmenta nd tendon reconstruction  PRECAUTIONS: None  SUBJECTIVE: She relays 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 Yes: NPRS scale:  5/10 today Pain location: Rt anterior knee Pain description: dull and achy but sharp if flexed too much Aggravating factors: static positioning, end range complaints.  Relieving factors: moving, ice, meds  OBJECTIVE:    PATIENT SURVEYS:  10/11/2021: FOTO 35% functional 10/28/2021: FOTO 44 (Goal 59) 12/19/21: FOTO 49% functional (goal 59)   MUSCLE LENGTH: 10/11/2021:  Tight H.S, and quads on Rt   PALPATION: 10/11/2021:  TTP and pain anterior knee  LE ROM:   AROM/PROM Right 10/11/2021 Right 10/13/2021  Right 10/20/21 Right 10/28/2021 Right 10/31/21 Right 11/10/21 Right 11/23/21 Right 12/19/21  Hip flexion            Hip extension            Hip abduction            Hip adduction            Hip internal rotation            Hip external rotation            Knee flexion 75/80 Supine heel slide AROM: 74   Supine AA: 83 Active 85 Active 85 Passive 95 P: 100 P: 104   A:105 P:112  Knee  extension 1/0  Seated AROM LAQ : -30 c pain  0 (Full extension) 0   0  Ankle dorsiflexion            Ankle plantarflexion            Ankle inversion            Ankle eversion             (Blank rows = not tested)   LE MMT:   MMT in sitting Right 10/11/2021 Right 10/31/21 Right 11/10/21 Right 12/19/21  Hip flexion 3/5     Hip extension       Hip abduction 3/5     Hip adduction       Hip internal rotation       Hip external rotation       Knee flexion 3/5 4/5 4/5 4  Knee extension 2/5 3/5 3+/5 4  Ankle dorsiflexion       Ankle plantarflexion       Ankle inversion       Ankle eversion        (Blank rows = not tested)   FUNCTIONAL TESTS:      GAIT: 10/11/2021:   Distance walked: 75 Assistive device utilized: Environmental consultant - 2 wheeled Level of assistance: supervision Comments: RW too short but is at its tallest level it can be adjusted, she has decreased hip/knee flexion and weight    shift to Rt  11/23/21 Gait with SPC 100 feet with supervision, gait at counter top for fwd and retro walking with SPC and hand hovering over counter top 5 round trips. 11/30/21 Gait with SPC 150 feet with supervision   12/05/21 Gait with SPC 150 feet with supervision 12/19/21 Gait without SPC 25 feet X 2 with close supervision, gait with SPC 150 feet mod I 12/21/21 Gait without SPC 50 feet X 1 with close supervision, gait with SPC 150 feet mod I    TODAY'S TREATMENT: 12/27/2021 Therapeutic Exercise: Recumbent bike seat #5 L3 full revolutions 8 min total Leg Press R leg only 37# X10 then DL 100# 2X10 Leg extension machine 5# eccentrics up with both and down with Rt only 2X10 Hamstring curl machine 25# 2X15 bilat  Vasonpnuematic device to Rt knee, medium compression, 34 deg X 15 min  12/19/2021 Therapeutic Exercise: Recumbent bike seat #5 L3 full revolutions 8 min total Gait see details above Leg Press R leg only 37# 3X10 then DL 93#  2X10 Step ups fwd and lateral for Rt leg 6 inch X 10 each using one UE  support March walking, tandem walking, and retrowalking at counter 3 round trips with light to no UE support Sit to stands slow eccentrics X10 no UE support Leg extension machine 5# eccentrics up with both and down with Rt only 2X10 Hamstring curl machine 25# 2X15 bilat    PATIENT EDUCATION:  10/11/2021 Education details: HEP,POC, Person educated: Patient and Spouse Education method: Explanation, Demonstration, Verbal cues, and Handouts Education comprehension: verbalized understanding, returned demonstration, and needs further education     HOME EXERCISE PROGRAM: 10/11/2021 Access Code: [email or web address] URL: [email or web address] Date: 10/11/2021 Prepared by: Elsie Ra   Exercises - Supine Heel Slide with Strap  - 2 x daily - 6 x weekly - 1-2 sets - 10 reps - 5 hold - Supine Quadricep Sets  - 2 x daily - 6 x weekly - 1-2 sets - 10 reps - 5 sec hold - Seated Long Arc Quad  - 2 x daily - 6 x weekly - 2-3 sets - 10 reps - Seated Knee Flexion Stretch  - 2 x daily - 6 x weekly - 1-2 sets - 10 reps - 5 sec hold - Seated Hamstring Stretch  - 2 x daily - 6 x weekly - 1 sets - 2-3 reps - 30 hold               ASSESSMENT:   CLINICAL IMPRESSION: She has expected swelling and inflammation from more than usual activity starting back at work. I held off on standing activity in PT today due to this and instead worked on strength and ROM in NWB. I did use vasopneumatic device to help reduce her swelling and pain after. She will continue to benefit from skilled PT address her functional deficits.   OBJECTIVE IMPAIRMENTS: decreased activity tolerance, difficulty walking, decreased balance, decreased endurance, decreased mobility, decreased ROM, decreased strength, impaired flexibility, impaired LE use, postural dysfunction, and pain.   ACTIVITY LIMITATIONS: bending, lifting, carry, locomotion, cleaning, community activity, driving, and or occupation   PERSONAL FACTORS: S/P RIGHT  TKA-09/19/2021, DVT and PE 2012, 2 other previous knee surgeries patella realignment and tendon reconstruction are also affecting patient's functional outcome.   REHAB POTENTIAL: good   CLINICAL DECISION MAKING: Stable/uncomplicated   EVALUATION COMPLEXITY: Low   GOALS: Short term PT Goals Target date: 11/08/2021 Pt will be I and compliant with HEP. Baseline:  Goal status: Met 10/19/2021 Pt will decrease pain by 25% overall Baseline: Goal status: MET 11/30/21   Long term PT goals Target date: 01/16/2022 Pt will improve Rt knee AROM to 0-100 degrees to improve functional mobility Baseline: Goal status: MET 12/19/21 Pt will improve  hip/knee strength to at least 5-/5 MMT to improve functional strength Baseline: Goal status: On Going 12/21/21 Pt will improve FOTO to at least 59% functional to show improved function Baseline: 44 Goal status: On Going 10/28/2021 Pt will reduce pain by overall 50% overall with usual activity Baseline: Goal status: On Going 12/21/21 Pt will be able to ambulate community distances at least 1000 ft WNL gait pattern without complaints or assistance Baseline: Goal status: ongoing, now using cane for this, 12/21/21   PLAN: PT FREQUENCY: 2-3 times per week    PT DURATION: 12 weeks   PLANNED INTERVENTIONS (unless contraindicated): aquatic PT, Canalith repositioning, cryotherapy, Electrical stimulation, Iontophoresis with 4 mg/ml dexamethasome, Moist heat, traction, Ultrasound, gait training, Therapeutic exercise, balance training, neuromuscular re-education, patient/family education, prosthetic training,  manual techniques, passive ROM, dry needling, taping, vasopnuematic device, vestibular, spinal manipulations, joint manipulations   PLAN FOR NEXT SESSION:  hold BFR due to DVT contraindication, Emphasis remains flexion AROM, quadriceps strength, progress gait without AD as able, vaso if desired.   Elsie Ra, PT, DPT 12/27/21 2:28 PM

## 2021-12-29 ENCOUNTER — Encounter: Admitting: Physical Therapy

## 2022-01-03 ENCOUNTER — Encounter: Admitting: Physical Therapy

## 2022-01-05 ENCOUNTER — Ambulatory Visit: Admitting: Family Medicine

## 2022-01-05 ENCOUNTER — Encounter: Admitting: Physical Therapy

## 2022-02-02 ENCOUNTER — Other Ambulatory Visit: Payer: Self-pay | Admitting: Family Medicine

## 2022-02-02 ENCOUNTER — Encounter: Admitting: Family Medicine

## 2022-02-02 DIAGNOSIS — F900 Attention-deficit hyperactivity disorder, predominantly inattentive type: Secondary | ICD-10-CM

## 2022-02-02 MED ORDER — AMPHETAMINE-DEXTROAMPHETAMINE 30 MG PO TABS: 30.0000 mg | ORAL_TABLET | Freq: Every day | ORAL | 0 refills | Status: DC

## 2022-02-02 NOTE — Progress Notes (Signed)
Patient rescheduled

## 2022-02-27 ENCOUNTER — Ambulatory Visit (INDEPENDENT_AMBULATORY_CARE_PROVIDER_SITE_OTHER): Admitting: Family Medicine

## 2022-02-27 ENCOUNTER — Encounter: Payer: Self-pay | Admitting: Family Medicine

## 2022-02-27 VITALS — BP 112/76 | HR 61 | Temp 97.7°F | Resp 16 | Wt 188.4 lb

## 2022-02-27 DIAGNOSIS — G5600 Carpal tunnel syndrome, unspecified upper limb: Secondary | ICD-10-CM | POA: Insufficient documentation

## 2022-02-27 DIAGNOSIS — F900 Attention-deficit hyperactivity disorder, predominantly inattentive type: Secondary | ICD-10-CM | POA: Diagnosis not present

## 2022-02-27 DIAGNOSIS — Z8672 Personal history of thrombophlebitis: Secondary | ICD-10-CM | POA: Insufficient documentation

## 2022-02-27 DIAGNOSIS — R0683 Snoring: Secondary | ICD-10-CM | POA: Insufficient documentation

## 2022-02-27 DIAGNOSIS — M25869 Other specified joint disorders, unspecified knee: Secondary | ICD-10-CM | POA: Insufficient documentation

## 2022-02-27 DIAGNOSIS — F32A Depression, unspecified: Secondary | ICD-10-CM | POA: Insufficient documentation

## 2022-02-27 DIAGNOSIS — Z9884 Bariatric surgery status: Secondary | ICD-10-CM

## 2022-02-27 DIAGNOSIS — F419 Anxiety disorder, unspecified: Secondary | ICD-10-CM | POA: Insufficient documentation

## 2022-02-27 DIAGNOSIS — K219 Gastro-esophageal reflux disease without esophagitis: Secondary | ICD-10-CM | POA: Diagnosis not present

## 2022-02-27 DIAGNOSIS — F17200 Nicotine dependence, unspecified, uncomplicated: Secondary | ICD-10-CM | POA: Insufficient documentation

## 2022-02-27 DIAGNOSIS — K5000 Crohn's disease of small intestine without complications: Secondary | ICD-10-CM | POA: Insufficient documentation

## 2022-02-27 DIAGNOSIS — G4700 Insomnia, unspecified: Secondary | ICD-10-CM | POA: Insufficient documentation

## 2022-02-27 DIAGNOSIS — Z5181 Encounter for therapeutic drug level monitoring: Secondary | ICD-10-CM | POA: Insufficient documentation

## 2022-02-27 DIAGNOSIS — Z7901 Long term (current) use of anticoagulants: Secondary | ICD-10-CM | POA: Insufficient documentation

## 2022-02-27 DIAGNOSIS — R5383 Other fatigue: Secondary | ICD-10-CM | POA: Insufficient documentation

## 2022-02-27 DIAGNOSIS — Z7189 Other specified counseling: Secondary | ICD-10-CM | POA: Insufficient documentation

## 2022-02-27 DIAGNOSIS — I2699 Other pulmonary embolism without acute cor pulmonale: Secondary | ICD-10-CM | POA: Insufficient documentation

## 2022-02-27 DIAGNOSIS — H52229 Regular astigmatism, unspecified eye: Secondary | ICD-10-CM | POA: Insufficient documentation

## 2022-02-27 DIAGNOSIS — J309 Allergic rhinitis, unspecified: Secondary | ICD-10-CM | POA: Insufficient documentation

## 2022-02-27 DIAGNOSIS — Z789 Other specified health status: Secondary | ICD-10-CM | POA: Insufficient documentation

## 2022-02-27 DIAGNOSIS — J029 Acute pharyngitis, unspecified: Secondary | ICD-10-CM | POA: Insufficient documentation

## 2022-02-27 DIAGNOSIS — H9319 Tinnitus, unspecified ear: Secondary | ICD-10-CM | POA: Insufficient documentation

## 2022-02-27 DIAGNOSIS — Z48815 Encounter for surgical aftercare following surgery on the digestive system: Secondary | ICD-10-CM | POA: Insufficient documentation

## 2022-02-27 DIAGNOSIS — Z90710 Acquired absence of both cervix and uterus: Secondary | ICD-10-CM | POA: Insufficient documentation

## 2022-02-27 DIAGNOSIS — M549 Dorsalgia, unspecified: Secondary | ICD-10-CM | POA: Insufficient documentation

## 2022-02-27 DIAGNOSIS — G479 Sleep disorder, unspecified: Secondary | ICD-10-CM | POA: Insufficient documentation

## 2022-02-27 DIAGNOSIS — M25559 Pain in unspecified hip: Secondary | ICD-10-CM | POA: Insufficient documentation

## 2022-02-27 DIAGNOSIS — K2960 Other gastritis without bleeding: Secondary | ICD-10-CM | POA: Insufficient documentation

## 2022-02-27 DIAGNOSIS — B370 Candidal stomatitis: Secondary | ICD-10-CM | POA: Insufficient documentation

## 2022-02-27 DIAGNOSIS — F518 Other sleep disorders not due to a substance or known physiological condition: Secondary | ICD-10-CM | POA: Insufficient documentation

## 2022-02-27 DIAGNOSIS — IMO0001 Reserved for inherently not codable concepts without codable children: Secondary | ICD-10-CM | POA: Insufficient documentation

## 2022-02-27 DIAGNOSIS — M7989 Other specified soft tissue disorders: Secondary | ICD-10-CM | POA: Insufficient documentation

## 2022-02-27 DIAGNOSIS — M25569 Pain in unspecified knee: Secondary | ICD-10-CM | POA: Insufficient documentation

## 2022-02-27 DIAGNOSIS — Z713 Dietary counseling and surveillance: Secondary | ICD-10-CM | POA: Insufficient documentation

## 2022-02-27 DIAGNOSIS — J329 Chronic sinusitis, unspecified: Secondary | ICD-10-CM | POA: Insufficient documentation

## 2022-02-27 DIAGNOSIS — H5210 Myopia, unspecified eye: Secondary | ICD-10-CM | POA: Insufficient documentation

## 2022-02-27 MED ORDER — AMPHETAMINE-DEXTROAMPHET ER 30 MG PO CP24: 30.0000 mg | ORAL_CAPSULE | ORAL | 0 refills | Status: DC

## 2022-02-27 MED ORDER — OMEPRAZOLE 20 MG PO CPDR: 20.0000 mg | DELAYED_RELEASE_CAPSULE | Freq: Every day | ORAL | 1 refills | Status: DC

## 2022-03-01 ENCOUNTER — Encounter: Payer: Self-pay | Admitting: Family Medicine

## 2022-03-01 NOTE — Progress Notes (Signed)
Established Patient Office Visit  Subjective    Patient ID: Sophia Long, female    DOB: 1981-03-02  Age: 41 y.o. MRN: [id number]  CC:  Chief Complaint  Patient presents with   Follow-up    HPI Sophia Long presents for routine follow up of chronic med issuesincluding ADHD and GERD> patient denies acute complaints or concerns.   Outpatient Encounter Medications as of 02/27/2022  Medication Sig   amphetamine-dextroamphetamine (ADDERALL XR) 30 MG 24 hr capsule Take 1 capsule (30 mg total) by mouth every morning.   alum & mag hydroxide-simeth (MAALOX/MYLANTA) 200-200-20 MG/5ML suspension Take by mouth every 6 (six) hours as needed for indigestion or heartburn.   calcium carbonate (TUMS - DOSED IN MG ELEMENTAL CALCIUM) 500 MG chewable tablet Chew 1 tablet by mouth daily.   docusate sodium (COLACE) 100 MG capsule Take 1 capsule (100 mg total) by mouth daily as needed.   methocarbamol (ROBAXIN-750) 750 MG tablet Take 1 tablet (750 mg total) by mouth every 8 (eight) hours as needed for muscle spasms.   omeprazole (PRILOSEC) 20 MG capsule Take 1 capsule (20 mg total) by mouth daily.   pantoprazole (PROTONIX) 20 MG tablet Take 1 tablet by mouth daily.   predniSONE (STERAPRED UNI-PAK 21 TAB) 10 MG (21) TBPK tablet Take as directed   rivaroxaban (XARELTO) 10 MG TABS tablet Take 1 tablet (10 mg total) by mouth daily. To prevent blood clots   traZODone (DESYREL) 100 MG tablet Take 1 tablet (100 mg total) by mouth at bedtime. (Patient taking differently: Take 100 mg by mouth at bedtime as needed for sleep.)   [DISCONTINUED] amphetamine-dextroamphetamine (ADDERALL) 30 MG tablet Take 1 tablet by mouth daily.   [DISCONTINUED] omeprazole (PRILOSEC) 20 MG capsule Take 1 capsule (20 mg total) by mouth daily.   No facility-administered encounter medications on file as of 02/27/2022.    Past Medical History:  Diagnosis Date   Acoustic neuroma [facility]) 2013   ADHD (attention deficit hyperactivity disorder)     Asthma    Crohn's disease ([place]) 2000   DVT (deep venous thrombosis) ([place]) 2012   History of hiatal hernia 2018   Pulmonary embolism ([place]) 2012    Past Surgical History:  Procedure Laterality Date   ABDOMINAL HYSTERECTOMY  2009   partial   CESAREAN SECTION  2002   x 4 (2002, 2004, 2005, 2008)   CHOLECYSTECTOMY  2000   GASTRIC BYPASS  2019   HARDWARE REMOVAL Left 2015   knee   KNEE SURGERY Right 2012   KNEE SURGERY Left 2013   LAPAROSCOPIC GASTRIC SLEEVE RESECTION  2018   TENDON RECONSTRUCTION Right 2015   TOTAL KNEE ARTHROPLASTY Right 09/19/2021   Procedure: RIGHT TOTAL KNEE ARTHROPLASTY;  Surgeon: Leandrew Koyanagi, MD;  Location: [place];  Service: Orthopedics;  Laterality: Right;   TUBAL LIGATION  2005    Family History  Problem Relation Age of Onset   Hypertension Mother    Diabetes Father    Colon cancer Maternal Uncle    Stomach cancer Maternal Uncle     Social History   Socioeconomic History   Marital status: Married    Spouse name: Saralyn Pilar   Number of children: 6   Years of education: Not on file   Highest education level: Not on file  Occupational History   Not on file  Tobacco Use   Smoking status: Every Day    Packs/day: 0.20    Types: Cigarettes   Smokeless tobacco: Never  Vaping Use  Vaping Use: Never used  Substance and Sexual Activity   Alcohol use: Not Currently   Drug use: Never   Sexual activity: Yes  Other Topics Concern   Not on file  Social History Narrative   Not on file   Social Determinants of Health   Financial Resource Strain: Not on file  Food Insecurity: Not on file  Transportation Needs: Not on file  Physical Activity: Not on file  Stress: Not on file  Social Connections: Not on file  Intimate Partner Violence: Not on file    Review of Systems  All other systems reviewed and are negative.       Objective    BP 112/76   Pulse 61   Temp 97.7 F (36.5 C) (Oral)   Resp 16   Wt 188 lb 6.4 oz (85.5 kg)   SpO2 97%    BMI 33.37 kg/m   Physical Exam Vitals and nursing note reviewed.  Constitutional:      General: She is not in acute distress.    Appearance: She is obese.  Cardiovascular:     Rate and Rhythm: Normal rate and regular rhythm.  Pulmonary:     Effort: Pulmonary effort is normal.     Breath sounds: Normal breath sounds.  Abdominal:     Palpations: Abdomen is soft.     Tenderness: There is no abdominal tenderness.  Neurological:     General: No focal deficit present.     Mental Status: She is alert and oriented to person, place, and time.  Psychiatric:        Mood and Affect: Mood is anxious.        Speech: Speech is rapid and pressured.        Behavior: Behavior is hyperactive. Behavior is cooperative.         Assessment & Plan:   1. Gastroesophageal reflux disease without esophagitis Meds refilled - omeprazole (PRILOSEC) 20 MG capsule; Take 1 capsule (20 mg total) by mouth daily.  Dispense: 90 capsule; Refill: 1  2. History of bariatric surgery As above  3. Attention deficit hyperactivity disorder (ADHD), predominantly inattentive type Meds refilled.     Return in about 3 months (around 05/29/2022) for follow up.   Becky Sax, MD

## 2022-03-24 ENCOUNTER — Other Ambulatory Visit: Payer: Self-pay | Admitting: Family Medicine

## 2022-03-29 ENCOUNTER — Other Ambulatory Visit: Payer: Self-pay | Admitting: Family Medicine

## 2022-03-30 ENCOUNTER — Telehealth: Payer: Self-pay | Admitting: Orthopaedic Surgery

## 2022-03-30 ENCOUNTER — Other Ambulatory Visit: Payer: Self-pay | Admitting: Family Medicine

## 2022-03-30 MED ORDER — OMEPRAZOLE 40 MG PO CPDR: 40.0000 mg | DELAYED_RELEASE_CAPSULE | Freq: Every day | ORAL | 3 refills | Status: DC

## 2022-03-30 MED ORDER — AMPHETAMINE-DEXTROAMPHET ER 30 MG PO CP24: 30.0000 mg | ORAL_CAPSULE | ORAL | 0 refills | Status: DC

## 2022-03-30 NOTE — Progress Notes (Signed)
Omeprazole

## 2022-03-30 NOTE — Telephone Encounter (Signed)
Pt sent in a mychart message for a follow up with R Xu. Unable to leave a message. Phone is not set for VM.

## 2022-05-01 ENCOUNTER — Other Ambulatory Visit: Payer: Self-pay | Admitting: Family Medicine

## 2022-05-05 ENCOUNTER — Ambulatory Visit: Admitting: Orthopaedic Surgery

## 2022-05-05 ENCOUNTER — Other Ambulatory Visit: Payer: Self-pay | Admitting: Family Medicine

## 2022-05-05 MED ORDER — AMPHETAMINE-DEXTROAMPHET ER 30 MG PO CP24: 30.0000 mg | ORAL_CAPSULE | ORAL | 0 refills | Status: DC

## 2022-05-09 ENCOUNTER — Ambulatory Visit: Admitting: Gastroenterology

## 2022-05-30 ENCOUNTER — Ambulatory Visit: Admitting: Family Medicine

## 2022-06-07 ENCOUNTER — Ambulatory Visit: Admitting: Orthopaedic Surgery

## 2022-06-28 ENCOUNTER — Ambulatory Visit (INDEPENDENT_AMBULATORY_CARE_PROVIDER_SITE_OTHER): Admitting: Family Medicine

## 2022-06-28 ENCOUNTER — Other Ambulatory Visit: Payer: Self-pay | Admitting: Family Medicine

## 2022-06-28 ENCOUNTER — Encounter: Payer: Self-pay | Admitting: Family Medicine

## 2022-06-28 VITALS — BP 105/69 | HR 69 | Temp 98.1°F | Resp 16 | Wt 196.2 lb

## 2022-06-28 DIAGNOSIS — F900 Attention-deficit hyperactivity disorder, predominantly inattentive type: Secondary | ICD-10-CM | POA: Diagnosis not present

## 2022-06-28 DIAGNOSIS — K219 Gastro-esophageal reflux disease without esophagitis: Secondary | ICD-10-CM

## 2022-06-28 MED ORDER — AMPHETAMINE-DEXTROAMPHET ER 30 MG PO CP24: 30.0000 mg | ORAL_CAPSULE | ORAL | 0 refills | Status: DC

## 2022-06-28 NOTE — Progress Notes (Unsigned)
Established Patient Office Visit  Subjective    Patient ID: Sophia Long, female    DOB: 08-21-1980  Age: 42 y.o. MRN: [id number]  CC:  Chief Complaint  Patient presents with   Medication Refill    HPI Aarin Sparkman presents to establish care   Outpatient Encounter Medications as of 06/28/2022  Medication Sig   alum & mag hydroxide-simeth (MAALOX/MYLANTA) 200-200-20 MG/5ML suspension Take by mouth every 6 (six) hours as needed for indigestion or heartburn.   amphetamine-dextroamphetamine (ADDERALL XR) 30 MG 24 hr capsule Take 1 capsule (30 mg total) by mouth every morning.   calcium carbonate (TUMS - DOSED IN MG ELEMENTAL CALCIUM) 500 MG chewable tablet Chew 1 tablet by mouth daily.   docusate sodium (COLACE) 100 MG capsule Take 1 capsule (100 mg total) by mouth daily as needed.   methocarbamol (ROBAXIN-750) 750 MG tablet Take 1 tablet (750 mg total) by mouth every 8 (eight) hours as needed for muscle spasms.   omeprazole (PRILOSEC) 40 MG capsule Take 1 capsule (40 mg total) by mouth daily.   pantoprazole (PROTONIX) 20 MG tablet Take 1 tablet by mouth daily.   predniSONE (STERAPRED UNI-PAK 21 TAB) 10 MG (21) TBPK tablet Take as directed   rivaroxaban (XARELTO) 10 MG TABS tablet Take 1 tablet (10 mg total) by mouth daily. To prevent blood clots   traZODone (DESYREL) 100 MG tablet Take 1 tablet (100 mg total) by mouth at bedtime. (Patient taking differently: Take 100 mg by mouth at bedtime as needed for sleep.)   No facility-administered encounter medications on file as of 06/28/2022.    Past Medical History:  Diagnosis Date   Acoustic neuroma [facility]) 2013   ADHD (attention deficit hyperactivity disorder)    Asthma    Crohn's disease ([place]) 2000   DVT (deep venous thrombosis) ([place]) 2012   History of hiatal hernia 2018   Pulmonary embolism ([place]) 2012    Past Surgical History:  Procedure Laterality Date   ABDOMINAL HYSTERECTOMY  2009   partial   CESAREAN SECTION  2002   x 4  (2002, 2004, 2005, 2008)   CHOLECYSTECTOMY  2000   GASTRIC BYPASS  2019   HARDWARE REMOVAL Left 2015   knee   KNEE SURGERY Right 2012   KNEE SURGERY Left 2013   LAPAROSCOPIC GASTRIC SLEEVE RESECTION  2018   TENDON RECONSTRUCTION Right 2015   TOTAL KNEE ARTHROPLASTY Right 09/19/2021   Procedure: RIGHT TOTAL KNEE ARTHROPLASTY;  Surgeon: Leandrew Koyanagi, MD;  Location: [place];  Service: Orthopedics;  Laterality: Right;   TUBAL LIGATION  2005    Family History  Problem Relation Age of Onset   Hypertension Mother    Diabetes Father    Colon cancer Maternal Uncle    Stomach cancer Maternal Uncle     Social History   Socioeconomic History   Marital status: Married    Spouse name: Saralyn Pilar   Number of children: 6   Years of education: Not on file   Highest education level: Not on file  Occupational History   Not on file  Tobacco Use   Smoking status: Every Day    Packs/day: 0.20    Types: Cigarettes   Smokeless tobacco: Never  Vaping Use   Vaping Use: Never used  Substance and Sexual Activity   Alcohol use: Not Currently   Drug use: Never   Sexual activity: Yes  Other Topics Concern   Not on file  Social History Narrative   Not on  file   Social Determinants of Health   Financial Resource Strain: Not on file  Food Insecurity: Not on file  Transportation Needs: Not on file  Physical Activity: Not on file  Stress: Not on file  Social Connections: Not on file  Intimate Partner Violence: Not on file    ROS      Objective    BP 105/69   Pulse 69   Temp 98.1 F (36.7 C) (Oral)   Resp 16   Wt 196 lb 3.2 oz (89 kg)   SpO2 98%   BMI 34.76 kg/m   Physical Exam  {Labs (Optional):23779}    Assessment & Plan:   Problem List Items Addressed This Visit   None Visit Diagnoses     Attention deficit hyperactivity disorder (ADHD), predominantly inattentive type    -  Primary       No follow-ups on file.   Becky Sax, MD

## 2022-06-29 ENCOUNTER — Encounter: Payer: Self-pay | Admitting: Family Medicine

## 2022-07-04 NOTE — Congregational Nurse Program (Signed)
Met with client along with her husband and two sons here at the [employer] this afternoon. This is family's first time being un housed and there is a concern for their two sons in this situation. They have already been looking for housing. Have been in a hotel for past two weeks and using uber to get boy's back and forth to school. Client states that they all all in fairly good health and do have a [facility] PCP.  Plan:  Covid screening completed and all are negative. Encouraged to wear mask in shelter due to close proximity and increase in Respiratory viruses. Nurse told client she could speak with FA about bus passes.  Nurse will f/u as needed.  Sophia Buske D. Sophia Caraway MSN, Advice worker Nurse [employer] [phone]

## 2022-07-28 ENCOUNTER — Other Ambulatory Visit: Payer: Self-pay | Admitting: Family Medicine

## 2022-07-28 MED ORDER — AMPHETAMINE-DEXTROAMPHET ER 30 MG PO CP24: 30.0000 mg | ORAL_CAPSULE | ORAL | 0 refills | Status: DC

## 2022-07-28 NOTE — Telephone Encounter (Signed)
Patient called and said pharmacy only has enough medication for 2 prescriptions.

## 2022-07-28 NOTE — Telephone Encounter (Signed)
Requested medications are due for refill today.  yes  Requested medications are on the active medications list.  yes  Last refill. 06/28/2022 #30 0 rf  Future visit scheduled.   yes  Notes to clinic.  Refill not delegated.    Requested Prescriptions  Pending Prescriptions Disp Refills   amphetamine-dextroamphetamine (ADDERALL XR) 30 MG 24 hr capsule 30 capsule 0    Sig: Take 1 capsule (30 mg total) by mouth every morning.     Not Delegated - Psychiatry:  Stimulants/ADHD Failed - 07/28/2022  8:47 AM      Failed - This refill cannot be delegated      Failed - Urine Drug Screen completed in last 360 days      Passed - Last BP in normal range    BP Readings from Last 1 Encounters:  06/28/22 105/69         Passed - Last Heart Rate in normal range    Pulse Readings from Last 1 Encounters:  06/28/22 69         Passed - Valid encounter within last 6 months    Recent Outpatient Visits           1 month ago Attention deficit hyperactivity disorder (ADHD), predominantly inattentive type   [facility] [place], MD   5 months ago Gastroesophageal reflux disease without esophagitis   [place] [facility], MD   11 months ago Gastroesophageal reflux disease without esophagitis    [facility], MD   11 months ago Hearing loss of left ear, unspecified hearing loss type   [facility], Kriste Basque, NP       Future Appointments             In 4 days Dorna Mai, MD [facility]   In 5 months Dorna Mai, MD [facility]

## 2022-08-01 ENCOUNTER — Ambulatory Visit: Admitting: Family Medicine

## 2022-08-22 ENCOUNTER — Other Ambulatory Visit: Payer: Self-pay | Admitting: Family Medicine

## 2022-08-24 MED ORDER — AMPHETAMINE-DEXTROAMPHET ER 30 MG PO CP24: 30.0000 mg | ORAL_CAPSULE | ORAL | 0 refills | Status: DC

## 2022-08-28 ENCOUNTER — Other Ambulatory Visit: Payer: Self-pay | Admitting: Family Medicine

## 2022-08-28 ENCOUNTER — Encounter: Payer: Self-pay | Admitting: Family Medicine

## 2022-08-28 MED ORDER — AMPHETAMINE-DEXTROAMPHET ER 30 MG PO CP24: 30.0000 mg | ORAL_CAPSULE | ORAL | 0 refills | Status: DC

## 2022-08-28 NOTE — Telephone Encounter (Signed)
Patient request rx be sent to different pharmacy.

## 2022-09-25 ENCOUNTER — Other Ambulatory Visit: Payer: Self-pay | Admitting: Family Medicine

## 2022-09-26 ENCOUNTER — Ambulatory Visit: Admitting: Family Medicine

## 2022-09-27 ENCOUNTER — Other Ambulatory Visit: Payer: Self-pay | Admitting: Family Medicine

## 2022-09-28 MED ORDER — AMPHETAMINE-DEXTROAMPHET ER 30 MG PO CP24: 30.0000 mg | ORAL_CAPSULE | ORAL | 0 refills | Status: DC

## 2022-10-24 ENCOUNTER — Other Ambulatory Visit: Payer: Self-pay | Admitting: Family Medicine

## 2022-10-26 ENCOUNTER — Emergency Department (HOSPITAL_COMMUNITY)
Admission: EM | Admit: 2022-10-26 | Discharge: 2022-10-26 | Disposition: A | Discharge status: Home / Self Care | Attending: Emergency Medicine | Admitting: Emergency Medicine

## 2022-10-26 ENCOUNTER — Emergency Department (HOSPITAL_COMMUNITY)

## 2022-10-26 DIAGNOSIS — M545 Low back pain, unspecified: Secondary | ICD-10-CM | POA: Diagnosis not present

## 2022-10-26 DIAGNOSIS — R1013 Epigastric pain: Secondary | ICD-10-CM | POA: Diagnosis present

## 2022-10-26 DIAGNOSIS — R1031 Right lower quadrant pain: Secondary | ICD-10-CM | POA: Insufficient documentation

## 2022-10-26 DIAGNOSIS — R110 Nausea: Secondary | ICD-10-CM | POA: Insufficient documentation

## 2022-10-26 DIAGNOSIS — K2900 Acute gastritis without bleeding: Secondary | ICD-10-CM

## 2022-10-26 LAB — COMPREHENSIVE METABOLIC PANEL
ALT: 16 U/L (ref 0–44)
AST: 15 U/L (ref 15–41)
Albumin: 3.6 g/dL (ref 3.5–5.0)
Alkaline Phosphatase: 66 U/L (ref 38–126)
Anion gap: 11 (ref 5–15)
BUN: <5 mg/dL — ABNORMAL LOW (ref 6–20)
CO2: 24 mmol/L (ref 22–32)
Calcium: 9.1 mg/dL (ref 8.9–10.3)
Chloride: 103 mmol/L (ref 98–111)
Creatinine, Ser: 0.76 mg/dL (ref 0.44–1.00)
GFR, Estimated: >60 mL/min (ref >60)
Glucose, Bld: 161 mg/dL — ABNORMAL HIGH (ref 70–99)
Potassium: 3.9 mmol/L (ref 3.5–5.1)
Sodium: 138 mmol/L (ref 135–145)
Total Bilirubin: 0.8 mg/dL (ref 0.3–1.2)
Total Protein: 7 g/dL (ref 6.5–8.1)

## 2022-10-26 LAB — CBC
HCT: 37 % (ref 36.0–46.0)
Hemoglobin: 12.4 g/dL (ref 12.0–15.0)
MCH: 28.1 pg (ref 26.0–34.0)
MCHC: 33.5 g/dL (ref 30.0–36.0)
MCV: 83.7 fL (ref 80.0–100.0)
Platelets: 279 10*3/uL (ref 150–400)
RBC: 4.42 MIL/uL (ref 3.87–5.11)
RDW: 18 % — ABNORMAL HIGH (ref 11.5–15.5)
WBC: 6.2 10*3/uL (ref 4.0–10.5)
nRBC: 0 % (ref 0.0–0.2)

## 2022-10-26 LAB — URINALYSIS, ROUTINE W REFLEX MICROSCOPIC
Bilirubin Urine: NEGATIVE
Glucose, UA: NEGATIVE mg/dL
Hgb urine dipstick: NEGATIVE
Ketones, ur: 20 mg/dL — AB
Leukocytes,Ua: NEGATIVE
Nitrite: NEGATIVE
Protein, ur: NEGATIVE mg/dL
Specific Gravity, Urine: 1.018 (ref 1.005–1.030)
pH: 7 (ref 5.0–8.0)

## 2022-10-26 LAB — LIPASE, BLOOD: Lipase: 37 U/L (ref 11–51)

## 2022-10-26 MED ORDER — AMPHETAMINE-DEXTROAMPHET ER 30 MG PO CP24: 30.0000 mg | ORAL_CAPSULE | ORAL | 0 refills | Status: DC

## 2022-10-26 MED ORDER — SODIUM CHLORIDE 0.9 % IV BOLUS
1000.0000 mL | Freq: Once | INTRAVENOUS | Status: AC
  Administered 2022-10-26: 1000 mL via INTRAVENOUS

## 2022-10-26 MED ORDER — ONDANSETRON 4 MG PO TBDP
4.0000 mg | ORAL_TABLET | Freq: Once | ORAL | Status: AC
  Administered 2022-10-26: 4 mg via ORAL
  Filled 2022-10-26: qty 1

## 2022-10-26 MED ORDER — FAMOTIDINE IN NACL 20-0.9 MG/50ML-% IV SOLN
20.0000 mg | Freq: Once | INTRAVENOUS | Status: AC
  Administered 2022-10-26: 20 mg via INTRAVENOUS
  Filled 2022-10-26: qty 50

## 2022-10-26 MED ORDER — ONDANSETRON HCL 4 MG PO TABS: 4.0000 mg | ORAL_TABLET | Freq: Three times a day (TID) | ORAL | 0 refills | Status: AC | PRN

## 2022-10-26 MED ORDER — ALUM & MAG HYDROXIDE-SIMETH 200-200-20 MG/5ML PO SUSP
30.0000 mL | Freq: Once | ORAL | Status: AC
  Administered 2022-10-26: 30 mL via ORAL
  Filled 2022-10-26: qty 30

## 2022-10-26 MED ORDER — IOHEXOL 350 MG/ML SOLN
75.0000 mL | Freq: Once | INTRAVENOUS | Status: AC | PRN
  Administered 2022-10-26: 75 mL via INTRAVENOUS

## 2022-10-26 MED ORDER — LIDOCAINE VISCOUS HCL 2 % MT SOLN
15.0000 mL | Freq: Once | OROMUCOSAL | Status: AC
  Administered 2022-10-26: 15 mL via ORAL
  Filled 2022-10-26: qty 15

## 2022-10-26 MED ORDER — SODIUM CHLORIDE 0.9 % IV SOLN
12.5000 mg | Freq: Four times a day (QID) | INTRAVENOUS | Status: DC | PRN
  Administered 2022-10-26: 12.5 mg via INTRAVENOUS
  Filled 2022-10-26: qty 12.5

## 2022-10-26 MED ORDER — FENTANYL CITRATE PF 50 MCG/ML IJ SOSY
50.0000 ug | PREFILLED_SYRINGE | Freq: Once | INTRAMUSCULAR | Status: AC
  Administered 2022-10-26: 50 ug via INTRAVENOUS
  Filled 2022-10-26: qty 1

## 2022-10-26 MED ORDER — FAMOTIDINE 20 MG PO TABS: 20.0000 mg | ORAL_TABLET | Freq: Two times a day (BID) | ORAL | 0 refills | Status: AC

## 2022-10-26 NOTE — ED Provider Notes (Signed)
  Physical Exam  BP (!) 138/95 (BP Location: Right Arm)   Pulse 68   Temp 98 F (36.7 C) (Oral)   Resp 15   SpO2 99%   Physical Exam Vitals and nursing note reviewed.  Constitutional:      General: She is not in acute distress.    Appearance: She is well-developed.  HENT:     Head: Normocephalic and atraumatic.  Eyes:     Conjunctiva/sclera: Conjunctivae normal.  Cardiovascular:     Rate and Rhythm: Normal rate and regular rhythm.     Heart sounds: No murmur heard. Pulmonary:     Effort: Pulmonary effort is normal. No respiratory distress.     Breath sounds: Normal breath sounds.  Abdominal:     Palpations: Abdomen is soft.     Tenderness: There is abdominal tenderness in the epigastric area. There is no guarding or rebound.  Musculoskeletal:        General: No swelling.     Cervical back: Neck supple.  Skin:    General: Skin is warm and dry.     Capillary Refill: Capillary refill takes less than 2 seconds.  Neurological:     Mental Status: She is alert.  Psychiatric:        Mood and Affect: Mood normal.     Procedures  Procedures  ED Course / MDM   Clinical Course as of 10/26/22 1653  Thu Oct 26, 2022  1553 3 days epigastric pain, increased alcohol intake, L back and RLQ pain, hx cholecystectomy, CT okay, getting fluids and meds, re-eval [JD]    Clinical Course User Index [JD] Fulton Reek, MD   Medical Decision Making Amount and/or Complexity of Data Reviewed Labs: ordered.  Risk OTC drugs. Prescription drug management.   On reevaluation, patient's pain is mildly improved.  She still has some mild epigastric and left upper quadrant pain.  She reports drinking more alcohol than usual a few days ago prior to onset of pain, and has been taking ibuprofen at home.  She has history of multiple prior peptic ulcers with EGDs.  She is on omeprazole at home, she has not had any melena or hematochezia.  Your CT scan here is reassuring.  Lab work is reassuring as  well.  I suspect she has gastritis causing her symptoms.  We discussed cessation of alcohol, NSAID use, drinking plenty of fluids.  Will send home with famotidine, Zofran to help symptomatically.  Encouraged her to follow-up with her gastroenterologist and PCP sometime next week.  I gave her strict return precautions for any new or worsening symptoms.  She was comfortable this plan.  Discharged in stable condition.       Fulton Reek, MD 10/26/22 1655    Arby Barrette, MD 11/08/22 1350

## 2022-10-26 NOTE — ED Provider Notes (Signed)
[place] EMERGENCY DEPARTMENT AT [facility] Provider Note   CSN: [id number] Arrival date & time: 10/26/22  1201     History  Chief Complaint  Patient presents with   Abdominal Pain    Sophia Long is a 42 y.o. female.  Patient presents to the emergency department complaining of epigastric abdominal pain, right lower quadrant abdominal pain, and left-sided low back pain for the past 3 days.  Patient also endorses nausea without vomiting.  She denies chest pain, shortness of breath, urinary symptoms, vaginal discharge.  Patient with history of partial hysterectomy, gastric sleeve surgery several years ago.  States that she is been unable to eat solid foods but has been able to drink some water.  Past medical history otherwise significant for history of PE, hiatal hernia, ADHD, Crohn's disease, enteritis  HPI     Home Medications Prior to Admission medications   Medication Sig Start Date End Date Taking? Authorizing Provider  famotidine (PEPCID) 20 MG tablet Take 1 tablet (20 mg total) by mouth 2 (two) times daily. 10/26/22  Yes Barrie Dunker B, PA-C  ondansetron (ZOFRAN) 4 MG tablet Take 1 tablet (4 mg total) by mouth every 8 (eight) hours as needed for nausea or vomiting. 10/26/22  Yes Darrick Grinder, PA-C  alum & mag hydroxide-simeth (MAALOX/MYLANTA) 200-200-20 MG/5ML suspension Take by mouth every 6 (six) hours as needed for indigestion or heartburn.    [provider]  amphetamine-dextroamphetamine (ADDERALL XR) 30 MG 24 hr capsule Take 1 capsule (30 mg total) by mouth every morning. 10/26/22   Georganna Skeans, MD  calcium carbonate (TUMS - DOSED IN MG ELEMENTAL CALCIUM) 500 MG chewable tablet Chew 1 tablet by mouth daily.    [provider]  omeprazole (PRILOSEC) 40 MG capsule TAKE 1 CAPSULE (40 MG TOTAL) BY MOUTH DAILY. 09/25/22   Georganna Skeans, MD  traZODone (DESYREL) 100 MG tablet Take 1 tablet (100 mg total) by mouth at bedtime. Patient taking  differently: Take 100 mg by mouth at bedtime as needed for sleep. 08/22/21   Georganna Skeans, MD      Allergies    Morphine, Nitrofurantoin, Penicillins, Azithromycin, Cephalexin, Ciprofloxacin, Erythromycin, Ketorolac, Metronidazole, Ondansetron, and Other    Review of Systems   Review of Systems  Physical Exam Updated Vital Signs BP (!) 138/95 (BP Location: Right Arm)   Pulse 68   Temp 98 F (36.7 C) (Oral)   Resp 15   SpO2 99%  Physical Exam Vitals and nursing note reviewed.  Constitutional:      General: She is not in acute distress.    Appearance: She is well-developed.  HENT:     Head: Normocephalic and atraumatic.  Eyes:     Conjunctiva/sclera: Conjunctivae normal.  Cardiovascular:     Rate and Rhythm: Normal rate and regular rhythm.     Heart sounds: No murmur heard. Pulmonary:     Effort: Pulmonary effort is normal. No respiratory distress.     Breath sounds: Normal breath sounds.  Abdominal:     Palpations: Abdomen is soft.     Tenderness: There is abdominal tenderness in the right lower quadrant and epigastric area.  Musculoskeletal:        General: No swelling.     Cervical back: Neck supple.  Skin:    General: Skin is warm and dry.     Capillary Refill: Capillary refill takes less than 2 seconds.  Neurological:     Mental Status: She is alert.  Psychiatric:  Mood and Affect: Mood normal.     ED Results / Procedures / Treatments   Labs (all labs ordered are listed, but only abnormal results are displayed) Labs Reviewed  COMPREHENSIVE METABOLIC PANEL - Abnormal; Notable for the following components:      Result Value   Glucose, Bld 161 (*)    BUN <5 (*)    All other components within normal limits  CBC - Abnormal; Notable for the following components:   RDW 18.0 (*)    All other components within normal limits  URINALYSIS, ROUTINE W REFLEX MICROSCOPIC - Abnormal; Notable for the following components:   APPearance HAZY (*)    Ketones, ur 20  (*)    All other components within normal limits  LIPASE, BLOOD    EKG None  Radiology CT ABDOMEN PELVIS W CONTRAST  Result Date: 10/26/2022 CLINICAL DATA:  Nonlocalized abdominal pain radiates to the left lower back. EXAM: CT ABDOMEN AND PELVIS WITH CONTRAST TECHNIQUE: Multidetector CT imaging of the abdomen and pelvis was performed using the standard protocol following bolus administration of intravenous contrast. RADIATION DOSE REDUCTION: This exam was performed according to the departmental dose-optimization program which includes automated exposure control, adjustment of the mA and/or kV according to patient size and/or use of iterative reconstruction technique. CONTRAST:  75mL OMNIPAQUE IOHEXOL 350 MG/ML SOLN COMPARISON:  None Available. FINDINGS: Lower chest: Probable atelectasis or scarring at the posterior left costophrenic sulcus (image 30/5). Hepatobiliary: No suspicious focal abnormality within the liver parenchyma. Mild intra and extrahepatic biliary duct dilatation with common bile duct measuring 6 mm just proximal to the ampulla. Findings may be related to prior cholecystectomy. Pancreas: No focal mass lesion. No dilatation of the main duct. No intraparenchymal cyst. No peripancreatic edema. Spleen: No splenomegaly. No focal mass lesion. Adrenals/Urinary Tract: No adrenal nodule or mass. Kidneys unremarkable. No evidence for hydroureter. The urinary bladder appears normal for the degree of distention. Stomach/Bowel: Status post gastric bypass. No small bowel wall thickening. No small bowel dilatation. The terminal ileum is normal. The appendix is normal. No gross colonic mass. No colonic wall thickening. Vascular/Lymphatic: No abdominal aortic aneurysm. No abdominal aortic atherosclerotic calcification. There is no gastrohepatic or hepatoduodenal ligament lymphadenopathy. No retroperitoneal or mesenteric lymphadenopathy. No pelvic sidewall lymphadenopathy. Reproductive: Hysterectomy.  There  is no adnexal mass. Other: Trace free fluid noted in the cul-de-sac. Musculoskeletal: No worrisome lytic or sclerotic osseous abnormality. IMPRESSION: 1. No acute findings in the abdomen or pelvis. Specifically, no findings to explain the patient's history of abdominal pain. 2. Mild intra and extrahepatic biliary duct dilatation with common bile duct measuring 6 mm just proximal to the ampulla. Findings may be related to prior cholecystectomy. Correlation with liver function test may prove helpful. 3. Status post gastric bypass. 4. Trace free fluid in the cul-de-sac, likely physiologic. Electronically Signed   By: Kennith Center M.D.   On: 10/26/2022 15:17    Procedures Procedures    Medications Ordered in ED Medications  promethazine (PHENERGAN) 12.5 mg in sodium chloride 0.9 % 50 mL IVPB (0 mg Intravenous Stopped 10/26/22 1528)  famotidine (PEPCID) IVPB 20 mg premix (20 mg Intravenous New Bag/Given 10/26/22 1540)  alum & mag hydroxide-simeth (MAALOX/MYLANTA) 200-200-20 MG/5ML suspension 30 mL (has no administration in time range)    And  lidocaine (XYLOCAINE) 2 % viscous mouth solution 15 mL (has no administration in time range)  ondansetron (ZOFRAN-ODT) disintegrating tablet 4 mg (4 mg Oral Given 10/26/22 1246)  fentaNYL (SUBLIMAZE) injection 50 mcg (50  mcg Intravenous Given 10/26/22 1414)  iohexol (OMNIPAQUE) 350 MG/ML injection 75 mL (75 mLs Intravenous Contrast Given 10/26/22 1458)  sodium chloride 0.9 % bolus 1,000 mL (1,000 mLs Intravenous New Bag/Given 10/26/22 1541)    ED Course/ Medical Decision Making/ A&P Clinical Course as of 10/26/22 1606  Thu Oct 26, 2022  1553 3 days epigastric pain, increased alcohol intake, L back and RLQ pain, hx cholecystectomy, CT okay, getting fluids and meds, re-eval [JD]    Clinical Course User Index [JD] Fulton Reek, MD                             Medical Decision Making Amount and/or Complexity of Data Reviewed Labs: ordered.  Risk OTC  drugs. Prescription drug management.   This patient presents to the ED for concern of abdominal pain, this involves an extensive number of treatment options, and is a complaint that carries with it a high risk of complications and morbidity.  The differential diagnosis includes GERD, gastroenteritis, gastroparesis, bowel obstruction, appendicitis, others   Co morbidities that complicate the patient evaluation  History enteritis   Additional history obtained:  Additional history obtained from family at bedside  Lab Tests:  I Ordered, and personally interpreted labs.  The pertinent results include: Grossly unremarkable CMP, CBC, lipase, UA   Imaging Studies ordered:  I ordered imaging studies including CT abdomen pelvis with contrast I independently visualized and interpreted imaging which showed  1. No acute findings in the abdomen or pelvis. Specifically, no  findings to explain the patient's history of abdominal pain.  2. Mild intra and extrahepatic biliary duct dilatation with common  bile duct measuring 6 mm just proximal to the ampulla. Findings may  be related to prior cholecystectomy. Correlation with liver function  test may prove helpful.  3. Status post gastric bypass.  4. Trace free fluid in the cul-de-sac, likely physiologic   I agree with the radiologist interpretation   Problem List / ED Course / Critical interventions / Medication management   I ordered medication including fentanyl for pain, Zofran and Phenergan for nausea, Pepcid for reflux-like symptoms, normal saline for fluid resuscitation  Reevaluation of the patient after these medicines showed that the patient improved I have reviewed the patients home medicines and have made adjustments as needed   Social Determinants of Health:  Patient has [employer] for her primary insurance   Test / Admission - Considered:  Patient with no acute findings on CT abdomen pelvis.  Upon further assessment patient  endorses drinking alcohol the day before symptoms began.  This is atypical for the patient.  Family at bedside states this was a much larger amount of alcohol the patient would normally consume.  Question if this flared gastritis/GERD.  No appendicitis or pancreatitis noted on CT.  Normal lipase.  Patient endorses feeling somewhat better after medication administration.  Patient disposition currently pending reevaluation after fluid administration and Pepcid administration.  Patient care being transferred to Dr. Earlene Plater at time of shift handoff.        Final Clinical Impression(s) / ED Diagnoses Final diagnoses:  Epigastric pain    Rx / DC Orders ED Discharge Orders          Ordered    famotidine (PEPCID) 20 MG tablet  2 times daily        10/26/22 1605    ondansetron (ZOFRAN) 4 MG tablet  Every 8 hours PRN  10/26/22 1605              Darrick Grinder, PA-C 10/26/22 1606    Arby Barrette, MD 10/30/22 [phone]

## 2022-10-26 NOTE — Discharge Instructions (Addendum)
You were evaluated today for abdominal pain.  Your CT scan and lab work were reassuring.  Please take the prescribed Pepcid and Zofran as needed.  Please follow-up with your primary care provider for further evaluation and management.

## 2022-10-26 NOTE — ED Triage Notes (Signed)
Pt c/o generalized abd pain radiating to her L lower back for the last three days. Pt endorses nausea. Hx of gastric sleeve surgery several years prior. Unknown LMP d/t partial hysterectomy.

## 2022-10-26 NOTE — ED Provider Triage Note (Signed)
Emergency Medicine Provider Triage Evaluation Note  Sophia Long , a 42 y.o. female  was evaluated in triage.  Pt complains of abdominal pain, nausea, x 2 days. Complains of generalized abd pain, but worse in upper quadrants radiating to the back, decreased appetite. Some softer bowel movements. Been taking tylenol and ibuprofen without relief. Hx of gastric bypass surgery and prior hysterectomy.   Review of Systems  Positive: Abd pain, nausea, looser stools Negative: Urinary sx, vomiting, obstipation  Physical Exam  BP 130/83 (BP Location: Right Arm)   Pulse 65   Temp 98.3 F (36.8 C)   Resp 18   SpO2 100%  Gen:   Awake, no distress   Resp:  Normal effort  MSK:   Moves extremities without difficulty  Other:  Upper quadrants and epigastrium mildly tender  Medical Decision Making  Medically screening exam initiated at 12:41 PM.  Appropriate orders placed.  Seona Kerns was informed that the remainder of the evaluation will be completed by another provider, this initial triage assessment does not replace that evaluation, and the importance of remaining in the ED until their evaluation is complete.  Workup initiated including CT   Cylah Fannin T, PA-C 10/26/22 1243

## 2022-11-21 ENCOUNTER — Other Ambulatory Visit: Payer: Self-pay | Admitting: Family Medicine

## 2022-11-23 MED ORDER — AMPHETAMINE-DEXTROAMPHET ER 30 MG PO CP24: 30.0000 mg | ORAL_CAPSULE | ORAL | 0 refills | Status: DC

## 2022-12-07 ENCOUNTER — Other Ambulatory Visit (HOSPITAL_COMMUNITY)
Admission: RE | Admit: 2022-12-07 | Discharge: 2022-12-07 | Disposition: A | Discharge status: Home / Self Care | Source: Ambulatory Visit | Attending: Family Medicine | Admitting: Family Medicine

## 2022-12-07 ENCOUNTER — Ambulatory Visit (INDEPENDENT_AMBULATORY_CARE_PROVIDER_SITE_OTHER): Admitting: Family Medicine

## 2022-12-07 DIAGNOSIS — A64 Unspecified sexually transmitted disease: Secondary | ICD-10-CM | POA: Insufficient documentation

## 2022-12-07 DIAGNOSIS — R300 Dysuria: Secondary | ICD-10-CM | POA: Diagnosis not present

## 2022-12-07 DIAGNOSIS — M545 Low back pain, unspecified: Secondary | ICD-10-CM

## 2022-12-07 DIAGNOSIS — F17210 Nicotine dependence, cigarettes, uncomplicated: Secondary | ICD-10-CM

## 2022-12-07 LAB — POCT URINALYSIS DIP (CLINITEK)
Bilirubin, UA: NEGATIVE
Blood, UA: NEGATIVE
Glucose, UA: NEGATIVE mg/dL
Ketones, POC UA: NEGATIVE mg/dL
Leukocytes, UA: NEGATIVE
Nitrite, UA: NEGATIVE
POC PROTEIN,UA: NEGATIVE
Spec Grav, UA: >=1.03 — AB (ref 1.010–1.025)
Urobilinogen, UA: 0.2 E.U./dL — AB
pH, UA: 6 (ref 5.0–8.0)

## 2022-12-07 NOTE — Progress Notes (Signed)
Patient came in for STI testing and possible UTI check

## 2022-12-08 ENCOUNTER — Other Ambulatory Visit: Payer: Self-pay | Admitting: Family Medicine

## 2022-12-08 LAB — CERVICOVAGINAL ANCILLARY ONLY
Bacterial Vaginitis (gardnerella): POSITIVE — AB
Candida Glabrata: NEGATIVE
Candida Vaginitis: NEGATIVE
Chlamydia: NEGATIVE
Comment: NEGATIVE
Comment: NEGATIVE
Comment: NEGATIVE
Comment: NEGATIVE
Comment: NEGATIVE
Comment: NORMAL
Neisseria Gonorrhea: NEGATIVE
Trichomonas: NEGATIVE

## 2022-12-08 LAB — HIV ANTIBODY (ROUTINE TESTING W REFLEX): HIV Screen 4th Generation wRfx: NONREACTIVE

## 2022-12-08 MED ORDER — CLINDAMYCIN HCL 300 MG PO CAPS: 300.0000 mg | ORAL_CAPSULE | Freq: Two times a day (BID) | ORAL | 0 refills | Status: DC

## 2022-12-12 ENCOUNTER — Encounter: Payer: Self-pay | Admitting: Family Medicine

## 2022-12-12 NOTE — Progress Notes (Signed)
New Patient Office Visit  Subjective    Patient ID: Sophia Long, female    DOB: Sep 13, 1980  Age: 42 y.o. MRN: [id number]  CC:  Chief Complaint  Patient presents with   Exposure to STD    HPI Sophia Long presents with complaint of back pain as well as wanting an STI check. Patient denies known trauma or injury or fever/chills.    Outpatient Encounter Medications as of 12/07/2022  Medication Sig   alum & mag hydroxide-simeth (MAALOX/MYLANTA) 200-200-20 MG/5ML suspension Take by mouth every 6 (six) hours as needed for indigestion or heartburn.   amphetamine-dextroamphetamine (ADDERALL XR) 30 MG 24 hr capsule Take 1 capsule (30 mg total) by mouth every morning.   calcium carbonate (TUMS - DOSED IN MG ELEMENTAL CALCIUM) 500 MG chewable tablet Chew 1 tablet by mouth daily.   famotidine (PEPCID) 20 MG tablet Take 1 tablet (20 mg total) by mouth 2 (two) times daily.   omeprazole (PRILOSEC) 40 MG capsule TAKE 1 CAPSULE (40 MG TOTAL) BY MOUTH DAILY.   ondansetron (ZOFRAN) 4 MG tablet Take 1 tablet (4 mg total) by mouth every 8 (eight) hours as needed for nausea or vomiting.   traZODone (DESYREL) 100 MG tablet Take 1 tablet (100 mg total) by mouth at bedtime. (Patient taking differently: Take 100 mg by mouth at bedtime as needed for sleep.)   No facility-administered encounter medications on file as of 12/07/2022.    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   ADHD (attention deficit hyperactivity disorder)    Asthma    Crohn's disease (HCC) 2000   DVT (deep venous thrombosis) (HCC) 2012   History of hiatal hernia 2018   Pulmonary embolism (HCC) 2012    Past Surgical History:  Procedure Laterality Date   ABDOMINAL HYSTERECTOMY  2009   partial   CESAREAN SECTION  2002   x 4 (2002, 2004, 2005, 2008)   CHOLECYSTECTOMY  2000   GASTRIC BYPASS  2019   HARDWARE REMOVAL Left 2015   knee   KNEE SURGERY Right 2012   KNEE SURGERY Left 2013   LAPAROSCOPIC  GASTRIC SLEEVE RESECTION  2018   TENDON RECONSTRUCTION Right 2015   TOTAL KNEE ARTHROPLASTY Right 09/19/2021   Procedure: RIGHT TOTAL KNEE ARTHROPLASTY;  Surgeon: Tarry Kos, MD;  Location: [facility] OR;  Service: Orthopedics;  Laterality: Right;   TUBAL LIGATION  2005    Family History  Problem Relation Age of Onset   Hypertension Mother    Diabetes Father    Colon cancer Maternal Uncle    Stomach cancer Maternal Uncle     Social History   Socioeconomic History   Marital status: Married    Spouse name: Luisa Hart   Number of children: 6   Years of education: Not on file   Highest education level: Some college, no degree  Occupational History   Not on file  Tobacco Use   Smoking status: Every Day    Packs/day: .2    Types: Cigarettes   Smokeless tobacco: Never  Vaping Use   Vaping Use: Never used  Substance and Sexual Activity   Alcohol use: Not Currently   Drug use: Never   Sexual activity: Yes  Other Topics Concern   Not on file  Social History Narrative   Not on file   Social Determinants of Health   Financial Resource Strain: Low Risk  (12/07/2022)   Overall Financial Resource Strain (CARDIA)    Difficulty of  Paying Living Expenses: Not very hard  Food Insecurity: Food Insecurity Present (12/07/2022)   Hunger Vital Sign    Worried About Running Out of Food in the Last Year: Never true    Ran Out of Food in the Last Year: Sometimes true  Transportation Needs: Unmet Transportation Needs (12/07/2022)   PRAPARE - Transportation    Lack of Transportation (Medical): Yes    Lack of Transportation (Non-Medical): Yes  Physical Activity: Insufficiently Active (12/07/2022)   Exercise Vital Sign    Days of Exercise per Week: 4 days    Minutes of Exercise per Session: 30 min  Stress: No Stress Concern Present (12/07/2022)   [employer] of Occupational Health - Occupational Stress Questionnaire    Feeling of Stress : Not at all  Social Connections: Moderately Integrated  (12/07/2022)   Social Connection and Isolation Panel [NHANES]    Frequency of Communication with Friends and Family: More than three times a week    Frequency of Social Gatherings with Friends and Family: Once a week    Attends Religious Services: 1 to 4 times per year    Active Member of [employer] or Organizations: No    Attends Engineer, structural: Not on file    Marital Status: Married  Catering manager Violence: Not on file    Review of Systems  Genitourinary:  Positive for dysuria.  All other systems reviewed and are negative.       Objective    There were no vitals taken for this visit.  Physical Exam Vitals and nursing note reviewed.  Constitutional:      General: She is not in acute distress. Cardiovascular:     Rate and Rhythm: Normal rate and regular rhythm.  Pulmonary:     Effort: Pulmonary effort is normal.     Breath sounds: Normal breath sounds.  Abdominal:     Palpations: Abdomen is soft.     Tenderness: There is no abdominal tenderness.  Neurological:     General: No focal deficit present.     Mental Status: She is alert and oriented to person, place, and time.         Assessment & Plan:   1. STI (sexually transmitted infection)  - Cervicovaginal ancillary only - HIV antibody (with reflex)  2. Dysuria U/A unremarkable  3. Low back pain, unspecified back pain laterality, unspecified chronicity, unspecified whether sciatica present UA unremarkable - POCT URINALYSIS DIP (CLINITEK)    No follow-ups on file.   Tommie Raymond, MD

## 2022-12-25 ENCOUNTER — Other Ambulatory Visit: Payer: Self-pay | Admitting: Family

## 2022-12-25 MED ORDER — AMPHETAMINE-DEXTROAMPHET ER 30 MG PO CP24: 30.0000 mg | ORAL_CAPSULE | ORAL | 0 refills | Status: DC

## 2022-12-27 ENCOUNTER — Encounter: Admitting: Family Medicine

## 2023-01-25 ENCOUNTER — Other Ambulatory Visit: Payer: Self-pay | Admitting: Family

## 2023-01-26 MED ORDER — AMPHETAMINE-DEXTROAMPHET ER 30 MG PO CP24: 30.0000 mg | ORAL_CAPSULE | ORAL | 0 refills | Status: DC

## 2023-02-19 ENCOUNTER — Other Ambulatory Visit: Payer: Self-pay | Admitting: Family

## 2023-02-21 ENCOUNTER — Encounter: Payer: Self-pay | Admitting: Family Medicine

## 2023-02-21 ENCOUNTER — Ambulatory Visit (INDEPENDENT_AMBULATORY_CARE_PROVIDER_SITE_OTHER): Admitting: Family Medicine

## 2023-02-21 VITALS — BP 113/77 | HR 78 | Temp 98.5°F | Resp 16 | Ht 63.0 in | Wt 179.0 lb

## 2023-02-21 DIAGNOSIS — Z1159 Encounter for screening for other viral diseases: Secondary | ICD-10-CM

## 2023-02-21 DIAGNOSIS — Z13228 Encounter for screening for other metabolic disorders: Secondary | ICD-10-CM

## 2023-02-21 DIAGNOSIS — Z9884 Bariatric surgery status: Secondary | ICD-10-CM | POA: Diagnosis not present

## 2023-02-21 DIAGNOSIS — Z1329 Encounter for screening for other suspected endocrine disorder: Secondary | ICD-10-CM | POA: Diagnosis not present

## 2023-02-21 DIAGNOSIS — F900 Attention-deficit hyperactivity disorder, predominantly inattentive type: Secondary | ICD-10-CM

## 2023-02-21 DIAGNOSIS — Z130 Encounter for screening for diseases of the blood and blood-forming organs and certain disorders involving the immune mechanism: Secondary | ICD-10-CM | POA: Diagnosis not present

## 2023-02-21 DIAGNOSIS — Z13220 Encounter for screening for lipoid disorders: Secondary | ICD-10-CM

## 2023-02-21 DIAGNOSIS — Z1231 Encounter for screening mammogram for malignant neoplasm of breast: Secondary | ICD-10-CM

## 2023-02-21 DIAGNOSIS — Z0000 Encounter for general adult medical examination without abnormal findings: Secondary | ICD-10-CM

## 2023-02-21 MED ORDER — AMPHETAMINE-DEXTROAMPHET ER 30 MG PO CP24: 30.0000 mg | ORAL_CAPSULE | ORAL | 0 refills | Status: DC

## 2023-02-21 NOTE — Progress Notes (Unsigned)
Annual Exam

## 2023-02-22 ENCOUNTER — Encounter: Payer: Self-pay | Admitting: Family Medicine

## 2023-02-22 LAB — CBC WITH DIFFERENTIAL/PLATELET
Basophils Absolute: 0.1 10*3/uL (ref 0.0–0.2)
Basos: 1 %
EOS (ABSOLUTE): 0.1 10*3/uL (ref 0.0–0.4)
Eos: 1 %
Hematocrit: 33.6 % — ABNORMAL LOW (ref 34.0–46.6)
Hemoglobin: 10.9 g/dL — ABNORMAL LOW (ref 11.1–15.9)
Immature Grans (Abs): 0 10*3/uL (ref 0.0–0.1)
Immature Granulocytes: 0 %
Lymphocytes Absolute: 2 10*3/uL (ref 0.7–3.1)
Lymphs: 39 %
MCH: 26.7 pg (ref 26.6–33.0)
MCHC: 32.4 g/dL (ref 31.5–35.7)
MCV: 82 fL (ref 79–97)
Monocytes Absolute: 0.4 10*3/uL (ref 0.1–0.9)
Monocytes: 8 %
Neutrophils Absolute: 2.5 10*3/uL (ref 1.4–7.0)
Neutrophils: 51 %
Platelets: 324 10*3/uL (ref 150–450)
RBC: 4.09 x10E6/uL (ref 3.77–5.28)
RDW: 17.3 % — ABNORMAL HIGH (ref 11.7–15.4)
WBC: 5 10*3/uL (ref 3.4–10.8)

## 2023-02-22 LAB — CMP14+EGFR
ALT: 13 IU/L (ref 0–32)
AST: 11 IU/L (ref 0–40)
Albumin: 4.3 g/dL (ref 3.9–4.9)
Alkaline Phosphatase: 72 IU/L (ref 44–121)
BUN/Creatinine Ratio: 12 (ref 9–23)
BUN: 8 mg/dL (ref 6–24)
Bilirubin Total: 0.5 mg/dL (ref 0.0–1.2)
CO2: 23 mmol/L (ref 20–29)
Calcium: 8.9 mg/dL (ref 8.7–10.2)
Chloride: 109 mmol/L — ABNORMAL HIGH (ref 96–106)
Creatinine, Ser: 0.65 mg/dL (ref 0.57–1.00)
Globulin, Total: 2.7 g/dL (ref 1.5–4.5)
Glucose: 91 mg/dL (ref 70–99)
Potassium: 4.3 mmol/L (ref 3.5–5.2)
Sodium: 146 mmol/L — ABNORMAL HIGH (ref 134–144)
Total Protein: 7 g/dL (ref 6.0–8.5)
eGFR: 111 mL/min/{1.73_m2} (ref >59)

## 2023-02-22 LAB — LIPID PANEL
Chol/HDL Ratio: 2.3 ratio (ref 0.0–4.4)
Cholesterol, Total: 140 mg/dL (ref 100–199)
HDL: 60 mg/dL (ref >39)
LDL Chol Calc (NIH): 66 mg/dL (ref 0–99)
Triglycerides: 68 mg/dL (ref 0–149)
VLDL Cholesterol Cal: 14 mg/dL (ref 5–40)

## 2023-02-22 LAB — HEPATITIS C ANTIBODY: Hep C Virus Ab: NONREACTIVE

## 2023-02-22 LAB — VITAMIN D 25 HYDROXY (VIT D DEFICIENCY, FRACTURES): Vit D, 25-Hydroxy: 7.8 ng/mL — ABNORMAL LOW (ref 30.0–100.0)

## 2023-02-22 MED ORDER — VITAMIN D (ERGOCALCIFEROL) 1.25 MG (50000 UNIT) PO CAPS: 50000.0000 [IU] | ORAL_CAPSULE | ORAL | 0 refills | Status: AC

## 2023-02-22 NOTE — Progress Notes (Signed)
Established Patient Office Visit  Subjective    Patient ID: Sophia Long, female    DOB: [birth year]  Age: 42 y.o. MRN: [id number]  CC:  Chief Complaint  Patient presents with   Annual Exam    HPI Sophia Long presents for annual exam. Patient denies acute complaints but does want to see a surgeon for skin removal after bariatric weight loss.   Outpatient Encounter Medications as of 02/21/2023  Medication Sig   alum & mag hydroxide-simeth (MAALOX/MYLANTA) 200-200-20 MG/5ML suspension Take by mouth every 6 (six) hours as needed for indigestion or heartburn.   calcium carbonate (TUMS - DOSED IN MG ELEMENTAL CALCIUM) 500 MG chewable tablet Chew 1 tablet by mouth daily.   clindamycin (CLEOCIN) 300 MG capsule Take 1 capsule (300 mg total) by mouth 2 (two) times daily.   famotidine (PEPCID) 20 MG tablet Take 1 tablet (20 mg total) by mouth 2 (two) times daily.   omeprazole (PRILOSEC) 40 MG capsule TAKE 1 CAPSULE (40 MG TOTAL) BY MOUTH DAILY.   ondansetron (ZOFRAN) 4 MG tablet Take 1 tablet (4 mg total) by mouth every 8 (eight) hours as needed for nausea or vomiting.   traZODone (DESYREL) 100 MG tablet Take 1 tablet (100 mg total) by mouth at bedtime. (Patient taking differently: Take 100 mg by mouth at bedtime as needed for sleep.)   [DISCONTINUED] amphetamine-dextroamphetamine (ADDERALL XR) 30 MG 24 hr capsule Take 1 capsule (30 mg total) by mouth every morning.   amphetamine-dextroamphetamine (ADDERALL XR) 30 MG 24 hr capsule Take 1 capsule (30 mg total) by mouth every morning.   No facility-administered encounter medications on file as of 02/21/2023.    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   ADHD (attention deficit hyperactivity disorder)    Asthma    Crohn's disease (HCC) 2000   DVT (deep venous thrombosis) (HCC) 2012   History of hiatal hernia 2018   Pulmonary embolism (HCC) 2012    Past Surgical History:  Procedure Laterality Date   ABDOMINAL  HYSTERECTOMY  2009   partial   CESAREAN SECTION  2002   x 4 (2002, 2004, 2005, 2008)   CHOLECYSTECTOMY  2000   GASTRIC BYPASS  2019   HARDWARE REMOVAL Left 2015   knee   KNEE SURGERY Right 2012   KNEE SURGERY Left 2013   LAPAROSCOPIC GASTRIC SLEEVE RESECTION  2018   TENDON RECONSTRUCTION Right 2015   TOTAL KNEE ARTHROPLASTY Right 09/19/2021   Procedure: RIGHT TOTAL KNEE ARTHROPLASTY;  Surgeon: Tarry Kos, MD;  Location: [facility];  Service: Orthopedics;  Laterality: Right;   TUBAL LIGATION  2005    Family History  Problem Relation Age of Onset   Hypertension Mother    Diabetes Father    Colon cancer Maternal Uncle    Stomach cancer Maternal Uncle     Social History   Socioeconomic History   Marital status: Married    Spouse name: Luisa Hart   Number of children: 6   Years of education: Not on file   Highest education level: Some college, no degree  Occupational History   Not on file  Tobacco Use   Smoking status: Every Day    Current packs/day: 0.20    Types: Cigarettes   Smokeless tobacco: Never  Vaping Use   Vaping status: Never Used  Substance and Sexual Activity   Alcohol use: Not Currently   Drug use: Never   Sexual activity: Yes  Other Topics Concern  Not on file  Social History Narrative   Not on file   Social Determinants of Health   Financial Resource Strain: Low Risk  (12/07/2022)   Overall Financial Resource Strain (CARDIA)    Difficulty of Paying Living Expenses: Not very hard  Food Insecurity: No Food Insecurity (02/21/2023)   Hunger Vital Sign    Worried About Running Out of Food in the Last Year: Never true    Ran Out of Food in the Last Year: Never true  Recent Concern: Food Insecurity - Food Insecurity Present (12/07/2022)   Hunger Vital Sign    Worried About Running Out of Food in the Last Year: Never true    Ran Out of Food in the Last Year: Sometimes true  Transportation Needs: No Transportation Needs (02/21/2023)   PRAPARE - Therapist, art (Medical): No    Lack of Transportation (Non-Medical): No  Recent Concern: Transportation Needs - Unmet Transportation Needs (12/07/2022)   PRAPARE - Transportation    Lack of Transportation (Medical): Yes    Lack of Transportation (Non-Medical): Yes  Physical Activity: Sufficiently Active (02/21/2023)   Exercise Vital Sign    Days of Exercise per Week: 7 days    Minutes of Exercise per Session: 30 min  Recent Concern: Physical Activity - Insufficiently Active (12/07/2022)   Exercise Vital Sign    Days of Exercise per Week: 4 days    Minutes of Exercise per Session: 30 min  Stress: No Stress Concern Present (02/21/2023)   [employer] of Occupational Health - Occupational Stress Questionnaire    Feeling of Stress : Not at all  Social Connections: Moderately Integrated (02/21/2023)   Social Connection and Isolation Panel [NHANES]    Frequency of Communication with Friends and Family: More than three times a week    Frequency of Social Gatherings with Friends and Family: More than three times a week    Attends Religious Services: 1 to 4 times per year    Active Member of [employer] or Organizations: No    Attends Banker Meetings: Never    Marital Status: Married  Catering manager Violence: Not At Risk (02/21/2023)   Humiliation, Afraid, Rape, and Kick questionnaire    Fear of Current or Ex-Partner: No    Emotionally Abused: No    Physically Abused: No    Sexually Abused: No    Review of Systems  All other systems reviewed and are negative.       Objective    BP 113/77 (BP Location: Right Arm, Patient Position: Sitting, Cuff Size: Large)   Pulse 78   Temp 98.5 F (36.9 C) (Temporal)   Resp 16   Ht 5\' 3"  (1.6 m)   Wt 179 lb (81.2 kg)   SpO2 99%   BMI 31.71 kg/m   Physical Exam Vitals and nursing note reviewed.  Constitutional:      General: She is not in acute distress. HENT:     Head: Normocephalic and atraumatic.     Right Ear:  Tympanic membrane, ear canal and external ear normal.     Left Ear: Tympanic membrane, ear canal and external ear normal.     Nose: Nose normal.     Mouth/Throat:     Mouth: Mucous membranes are moist.     Pharynx: Oropharynx is clear.  Eyes:     Conjunctiva/sclera: Conjunctivae normal.     Pupils: Pupils are equal, round, and reactive to light.  Neck:  Thyroid: No thyromegaly.  Cardiovascular:     Rate and Rhythm: Normal rate and regular rhythm.     Heart sounds: Normal heart sounds. No murmur heard. Pulmonary:     Effort: Pulmonary effort is normal. No respiratory distress.     Breath sounds: Normal breath sounds.  Abdominal:     General: There is no distension.     Palpations: Abdomen is soft. There is no mass.     Tenderness: There is no abdominal tenderness.  Musculoskeletal:        General: Normal range of motion.     Cervical back: Normal range of motion and neck supple.  Skin:    General: Skin is warm and dry.  Neurological:     General: No focal deficit present.     Mental Status: She is alert and oriented to person, place, and time.  Psychiatric:        Mood and Affect: Mood normal.        Behavior: Behavior normal.         Assessment & Plan:   Annual physical exam -     CMP14+EGFR  Screening for lipid disorders -     Lipid panel  Screening for deficiency anemia -     CBC with Differential/Platelet  Screening for endocrine/metabolic/immunity disorders -     VITAMIN D 25 Hydroxy (Vit-D Deficiency, Fractures)  Attention deficit hyperactivity disorder (ADHD), predominantly inattentive type  Encounter for screening mammogram for malignant neoplasm of breast -     3D Screening Mammogram, Left and Right; Future  Need for hepatitis C screening test -     Hepatitis C antibody  History of bariatric surgery -     Ambulatory referral to Plastic Surgery  Other orders -     Amphetamine-Dextroamphet ER; Take 1 capsule (30 mg total) by mouth every  morning.  Dispense: 30 capsule; Refill: 0     Return in about 3 months (around 05/23/2023) for follow up.   Tommie Raymond, MD

## 2023-03-03 ENCOUNTER — Other Ambulatory Visit: Payer: Self-pay | Admitting: Family Medicine

## 2023-03-07 ENCOUNTER — Institutional Professional Consult (permissible substitution): Admitting: Plastic Surgery

## 2023-03-22 ENCOUNTER — Telehealth: Payer: Self-pay | Admitting: *Deleted

## 2023-03-22 ENCOUNTER — Other Ambulatory Visit: Payer: Self-pay | Admitting: Family Medicine

## 2023-03-22 ENCOUNTER — Encounter: Payer: Self-pay | Admitting: Family Medicine

## 2023-03-22 ENCOUNTER — Other Ambulatory Visit (HOSPITAL_COMMUNITY)
Admission: RE | Admit: 2023-03-22 | Discharge: 2023-03-22 | Disposition: A | Discharge status: Home / Self Care | Source: Ambulatory Visit | Attending: Family Medicine | Admitting: Family Medicine

## 2023-03-22 ENCOUNTER — Ambulatory Visit: Admitting: Family Medicine

## 2023-03-22 VITALS — BP 123/78 | HR 69 | Temp 98.1°F | Resp 16 | Wt 186.0 lb

## 2023-03-22 DIAGNOSIS — Z01419 Encounter for gynecological examination (general) (routine) without abnormal findings: Secondary | ICD-10-CM | POA: Insufficient documentation

## 2023-03-22 NOTE — Telephone Encounter (Signed)
refill 

## 2023-03-22 NOTE — Progress Notes (Signed)
Established Patient Office Visit  Subjective    Patient ID: Sophia Long, female    DOB: [birth year]  Age: 42 y.o. MRN: [id number]  CC:  Chief Complaint  Patient presents with   Gynecologic Exam    HPI [name] presents for routine pap smear. Patient denies cute complaints.   Outpatient Encounter Medications as of 03/22/2023  Medication Sig   alum & mag hydroxide-simeth (MAALOX/MYLANTA) 200-200-20 MG/5ML suspension Take by mouth every 6 (six) hours as needed for indigestion or heartburn.   amphetamine-dextroamphetamine (ADDERALL XR) 30 MG 24 hr capsule Take 1 capsule (30 mg total) by mouth every morning.   calcium carbonate (TUMS - DOSED IN MG ELEMENTAL CALCIUM) 500 MG chewable tablet Chew 1 tablet by mouth daily.   clindamycin (CLEOCIN) 300 MG capsule Take 1 capsule (300 mg total) by mouth 2 (two) times daily.   famotidine (PEPCID) 20 MG tablet Take 1 tablet (20 mg total) by mouth 2 (two) times daily.   omeprazole (PRILOSEC) 40 MG capsule TAKE 1 CAPSULE (40 MG TOTAL) BY MOUTH DAILY.   ondansetron (ZOFRAN) 4 MG tablet Take 1 tablet (4 mg total) by mouth every 8 (eight) hours as needed for nausea or vomiting.   traZODone (DESYREL) 100 MG tablet Take 1 tablet (100 mg total) by mouth at bedtime. (Patient taking differently: Take 100 mg by mouth at bedtime as needed for sleep.)   Vitamin D, Ergocalciferol, (DRISDOL) 1.25 MG (50000 UNIT) CAPS capsule Take 1 capsule (50,000 Units total) by mouth every 7 (seven) days.   No facility-administered encounter medications on file as of 03/22/2023.    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   ADHD (attention deficit hyperactivity disorder)    Asthma    Crohn's disease (HCC) 2000   DVT (deep venous thrombosis) (HCC) 2012   History of hiatal hernia 2018   Pulmonary embolism (HCC) 2012    Past Surgical History:  Procedure Laterality Date   ABDOMINAL HYSTERECTOMY  2009   partial   CESAREAN SECTION  2002   x 4  (2002, 2004, 2005, 2008)   CHOLECYSTECTOMY  2000   GASTRIC BYPASS  2019   HARDWARE REMOVAL Left 2015   knee   KNEE SURGERY Right 2012   KNEE SURGERY Left 2013   LAPAROSCOPIC GASTRIC SLEEVE RESECTION  2018   TENDON RECONSTRUCTION Right 2015   TOTAL KNEE ARTHROPLASTY Right 09/19/2021   Procedure: RIGHT TOTAL KNEE ARTHROPLASTY;  Surgeon: Tarry Kos, MD;  Location: [facility] OR;  Service: Orthopedics;  Laterality: Right;   TUBAL LIGATION  2005    Family History  Problem Relation Age of Onset   Hypertension Mother    Diabetes Father    Colon cancer Maternal Uncle    Stomach cancer Maternal Uncle     Social History   Socioeconomic History   Marital status: Married    Spouse name: Luisa Hart   Number of children: 6   Years of education: Not on file   Highest education level: Some college, no degree  Occupational History   Not on file  Tobacco Use   Smoking status: Every Day    Current packs/day: 0.20    Types: Cigarettes   Smokeless tobacco: Never  Vaping Use   Vaping status: Never Used  Substance and Sexual Activity   Alcohol use: Not Currently   Drug use: Never   Sexual activity: Yes  Other Topics Concern   Not on file  Social History Narrative  Not on file   Social Determinants of Health   Financial Resource Strain: Low Risk  (12/07/2022)   Overall Financial Resource Strain (CARDIA)    Difficulty of Paying Living Expenses: Not very hard  Food Insecurity: No Food Insecurity (02/21/2023)   Hunger Vital Sign    Worried About Running Out of Food in the Last Year: Never true    Ran Out of Food in the Last Year: Never true  Recent Concern: Food Insecurity - Food Insecurity Present (12/07/2022)   Hunger Vital Sign    Worried About Running Out of Food in the Last Year: Never true    Ran Out of Food in the Last Year: Sometimes true  Transportation Needs: No Transportation Needs (02/21/2023)   PRAPARE - Administrator, Civil Service (Medical): No    Lack of  Transportation (Non-Medical): No  Recent Concern: Transportation Needs - Unmet Transportation Needs (12/07/2022)   PRAPARE - Transportation    Lack of Transportation (Medical): Yes    Lack of Transportation (Non-Medical): Yes  Physical Activity: Sufficiently Active (02/21/2023)   Exercise Vital Sign    Days of Exercise per Week: 7 days    Minutes of Exercise per Session: 30 min  Recent Concern: Physical Activity - Insufficiently Active (12/07/2022)   Exercise Vital Sign    Days of Exercise per Week: 4 days    Minutes of Exercise per Session: 30 min  Stress: No Stress Concern Present (02/21/2023)   [employer] of Occupational Health - Occupational Stress Questionnaire    Feeling of Stress : Not at all  Social Connections: Moderately Integrated (02/21/2023)   Social Connection and Isolation Panel [NHANES]    Frequency of Communication with Friends and Family: More than three times a week    Frequency of Social Gatherings with Friends and Family: More than three times a week    Attends Religious Services: 1 to 4 times per year    Active Member of [employer] or Organizations: No    Attends Banker Meetings: Never    Marital Status: Married  Catering manager Violence: Not At Risk (02/21/2023)   Humiliation, Afraid, Rape, and Kick questionnaire    Fear of Current or Ex-Partner: No    Emotionally Abused: No    Physically Abused: No    Sexually Abused: No    Review of Systems  All other systems reviewed and are negative.       Objective    BP 123/78   Pulse 69   Temp 98.1 F (36.7 C) (Oral)   Resp 16   Wt 186 lb (84.4 kg)   SpO2 98%   BMI 32.95 kg/m   Physical Exam Vitals and nursing note reviewed.  Constitutional:      General: She is not in acute distress. Cardiovascular:     Rate and Rhythm: Normal rate and regular rhythm.  Pulmonary:     Effort: Pulmonary effort is normal.     Breath sounds: Normal breath sounds.  Abdominal:     Palpations: Abdomen is  soft.     Tenderness: There is no abdominal tenderness.     Hernia: There is no hernia in the left inguinal area or right inguinal area.  Genitourinary:    Exam position: Supine.     Labia:        Right: No lesion.        Left: No lesion.      Vagina: Normal.     Uterus: Absent.  Adnexa: Right adnexa normal.     Rectum: Normal.  Neurological:     General: No focal deficit present.     Mental Status: She is alert and oriented to person, place, and time.         Assessment & Plan:   Pap smear, as part of routine gynecological examination -     Cytology - PAP -     Cervicovaginal ancillary only     No follow-ups on file.   Tommie Raymond, MD

## 2023-03-23 MED ORDER — AMPHETAMINE-DEXTROAMPHET ER 30 MG PO CP24: 30.0000 mg | ORAL_CAPSULE | ORAL | 0 refills | Status: DC

## 2023-03-26 LAB — CERVICOVAGINAL ANCILLARY ONLY
Bacterial Vaginitis (gardnerella): POSITIVE — AB
Candida Glabrata: NEGATIVE
Candida Vaginitis: POSITIVE — AB
Chlamydia: NEGATIVE
Comment: NEGATIVE
Comment: NEGATIVE
Comment: NEGATIVE
Comment: NEGATIVE
Comment: NEGATIVE
Comment: NORMAL
Neisseria Gonorrhea: NEGATIVE
Trichomonas: NEGATIVE

## 2023-03-27 LAB — CYTOLOGY - PAP
Comment: NEGATIVE
Diagnosis: NEGATIVE
High risk HPV: NEGATIVE

## 2023-03-29 ENCOUNTER — Other Ambulatory Visit: Payer: Self-pay | Admitting: Family Medicine

## 2023-03-29 ENCOUNTER — Institutional Professional Consult (permissible substitution): Admitting: Plastic Surgery

## 2023-03-29 MED ORDER — CLINDAMYCIN HCL 300 MG PO CAPS: 300.0000 mg | ORAL_CAPSULE | Freq: Two times a day (BID) | ORAL | 0 refills | Status: AC

## 2023-03-29 MED ORDER — FLUCONAZOLE 150 MG PO TABS: 150.0000 mg | ORAL_TABLET | Freq: Once | ORAL | 0 refills | Status: AC

## 2023-04-18 ENCOUNTER — Ambulatory Visit

## 2023-04-19 ENCOUNTER — Institutional Professional Consult (permissible substitution): Admitting: Plastic Surgery

## 2023-05-01 ENCOUNTER — Other Ambulatory Visit: Payer: Self-pay | Admitting: Family Medicine

## 2023-05-02 ENCOUNTER — Encounter: Payer: Self-pay | Admitting: Family Medicine

## 2023-05-02 ENCOUNTER — Telehealth: Payer: Self-pay | Admitting: Family Medicine

## 2023-05-02 MED ORDER — AMPHETAMINE-DEXTROAMPHET ER 30 MG PO CP24: 30.0000 mg | ORAL_CAPSULE | ORAL | 0 refills | Status: DC

## 2023-05-02 NOTE — Telephone Encounter (Signed)
Already sent in today in a separate refill encounter.

## 2023-05-02 NOTE — Telephone Encounter (Signed)
Medication Refill -  Most Recent Primary Care Visit:  Provider: Georganna Skeans  Department: [facility]  Visit Type: OFFICE VISIT  Date: 03/22/2023  Medication: amphetamine-dextroamphetamine (ADDERALL XR) 30 MG 24 hr capsule   Has the patient contacted their pharmacy? no Pt requested through mychart Is this the correct pharmacy for this prescription? Yes If no, delete pharmacy and type the correct one.  This is the patient's preferred pharmacy:   Has the prescription been filled recently? Yes  Is the patient out of the medication? Yes  Has the patient been seen for an appointment in the last year OR does the patient have an upcoming appointment? Yes  Can we respond through MyChart? Yes  Pt is completely out of medication, and requesting asap

## 2023-05-25 ENCOUNTER — Other Ambulatory Visit: Payer: Self-pay | Admitting: Family Medicine

## 2023-05-30 MED ORDER — AMPHETAMINE-DEXTROAMPHET ER 30 MG PO CP24: 30.0000 mg | ORAL_CAPSULE | ORAL | 0 refills | Status: DC

## 2023-06-05 ENCOUNTER — Ambulatory Visit

## 2023-07-03 ENCOUNTER — Other Ambulatory Visit: Payer: Self-pay | Admitting: Family Medicine

## 2023-07-03 NOTE — Telephone Encounter (Signed)
 Medication Refill -  Most Recent Primary Care Visit:  Provider: TANDA BLEACHER  Department: [facility]  Visit Type: OFFICE VISIT  Date: 03/22/2023  Medication: amphetamine -dextroamphetamine  (ADDERALL XR) 30 MG 24 hr capsule   Has the patient contacted their pharmacy? Yes  Is this the correct pharmacy for this prescription? Yes If no, delete pharmacy and type the correct one.  This is the patient's preferred pharmacy:   [facility] [address], [place] - [address]  [address] [place] [address]  Phone: [phone] Fax: [phone]     Has the prescription been filled recently? Yes  Is the patient out of the medication? Yes  Has the patient been seen for an appointment in the last year OR does the patient have an upcoming appointment? Yes  Can we respond through MyChart? Yes  Agent: Please be advised that Rx refills may take up to 3 business days. We ask that you follow-up with your pharmacy.

## 2023-07-03 NOTE — Telephone Encounter (Signed)
 Requested medication (s) are due for refill today - yes  Requested medication (s) are on the active medication list -yes  Future visit scheduled -no  Last refill: 05/30/23 #30  Notes to clinic: non delegated Rx  Requested Prescriptions  Pending Prescriptions Disp Refills   amphetamine -dextroamphetamine  (ADDERALL XR) 30 MG 24 hr capsule 30 capsule 0    Sig: Take 1 capsule (30 mg total) by mouth every morning.     Not Delegated - Psychiatry:  Stimulants/ADHD Failed - 07/03/2023 12:25 PM      Failed - This refill cannot be delegated      Failed - Urine Drug Screen completed in last 360 days      Passed - Last BP in normal range    BP Readings from Last 1 Encounters:  03/22/23 123/78         Passed - Last Heart Rate in normal range    Pulse Readings from Last 1 Encounters:  03/22/23 69         Passed - Valid encounter within last 6 months    Recent Outpatient Visits           3 months ago Pap smear, as part of routine gynecological examination   [facility], MD   4 months ago Annual physical exam   [place], MD   6 months ago STI (sexually transmitted infection)   [facility] [facility], MD   1 year ago Attention deficit hyperactivity disorder (ADHD), predominantly inattentive type   [place] [facility] [place], MD   1 year ago Gastroesophageal reflux disease without esophagitis   [place] [facility], MD                 Requested Prescriptions  Pending Prescriptions Disp Refills   amphetamine -dextroamphetamine  (ADDERALL XR) 30 MG 24 hr capsule 30 capsule 0    Sig: Take 1 capsule (30 mg total) by mouth every morning.     Not Delegated - Psychiatry:  Stimulants/ADHD Failed - 07/03/2023 12:25 PM      Failed - This refill cannot be delegated      Failed - Urine Drug  Screen completed in last 360 days      Passed - Last BP in normal range    BP Readings from Last 1 Encounters:  03/22/23 123/78         Passed - Last Heart Rate in normal range    Pulse Readings from Last 1 Encounters:  03/22/23 69         Passed - Valid encounter within last 6 months    Recent Outpatient Visits           3 months ago Pap smear, as part of routine gynecological examination   [place] [facility], MD   4 months ago Annual physical exam   [place] [facility], MD   6 months ago STI (sexually transmitted infection)   [place] [facility], MD   1 year ago Attention deficit hyperactivity disorder (ADHD), predominantly inattentive type   [place] [facility], MD   1 year ago Gastroesophageal reflux disease without esophagitis   [place] [facility], MD

## 2023-07-05 ENCOUNTER — Telehealth: Payer: Self-pay | Admitting: Family Medicine

## 2023-07-06 NOTE — Telephone Encounter (Signed)
Pt is still in [place].  She said she will be back in about two weeks but is out of the adderall now.  CB#  [phone]

## 2023-07-09 NOTE — Telephone Encounter (Signed)
I called and spoke with patient.  I made her aware that MD Andrey Campanile will not be back in until tomorrow.

## 2023-07-10 NOTE — Telephone Encounter (Signed)
I called patient twice to let her know that MD Andrey Campanile sttaed she can not send in her medication to [place].  Phone went to voicemail and it said the voicemail was full.

## 2023-07-11 ENCOUNTER — Other Ambulatory Visit: Payer: Self-pay | Admitting: Family Medicine

## 2023-07-11 NOTE — Telephone Encounter (Signed)
I called and spoke with patient husband and made him aware that MD Andrey Campanile stated that she can not call in the medication to [place].

## 2023-07-18 ENCOUNTER — Other Ambulatory Visit: Payer: Self-pay | Admitting: Family Medicine

## 2023-07-23 MED ORDER — AMPHETAMINE-DEXTROAMPHET ER 30 MG PO CP24: 30.0000 mg | ORAL_CAPSULE | ORAL | 0 refills | Status: DC

## 2023-08-03 ENCOUNTER — Encounter: Payer: Self-pay | Admitting: Family Medicine

## 2023-08-03 ENCOUNTER — Ambulatory Visit: Payer: Medicaid Other | Admitting: Family Medicine

## 2023-08-24 ENCOUNTER — Other Ambulatory Visit: Payer: Self-pay | Admitting: Family Medicine

## 2023-08-24 MED ORDER — AMPHETAMINE-DEXTROAMPHET ER 30 MG PO CP24: 30.0000 mg | ORAL_CAPSULE | ORAL | 0 refills | Status: AC

## 2023-09-12 ENCOUNTER — Other Ambulatory Visit: Payer: Self-pay | Admitting: Family Medicine

## 2023-09-27 ENCOUNTER — Other Ambulatory Visit: Payer: Self-pay | Admitting: Family Medicine

## 2023-11-22 IMAGING — MR MR KNEE*R* W/O CM
4 of 6 series · 19 of 40 positions shown · non-contrast
Comparison: Right knee radiographs 08/25/2021

CLINICAL DATA: Chronic right knee pain for 10 years. Evaluate
degenerative joint disease. History of surgery twice for patellar
realignment.

EXAM:
MRI OF THE RIGHT KNEE WITHOUT CONTRAST
TECHNIQUE: Multiplanar, multisequence MR imaging of the knee was performed. No
intravenous contrast was administered.

[Series 3: T2 fat-sat · axial · 4.0mm · 0.50mm/px · z∈[-42,+38]mm · 3 of 26 slices shown (1 of 2)]
[im 5/26]
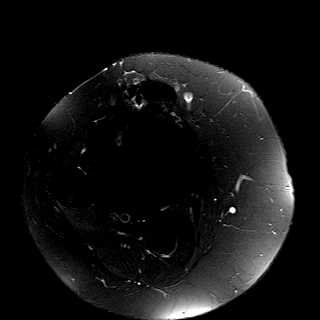
[im 13/26]
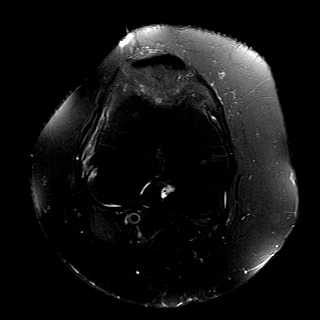
[im 21/26]
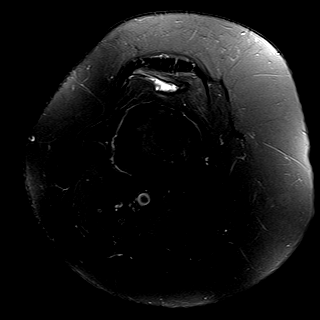

[Series 5: T2 fat-sat · coronal · 4.0mm · 0.29mm/px · 3 of 26 slices shown (2 of 2)]
[im 6/26]
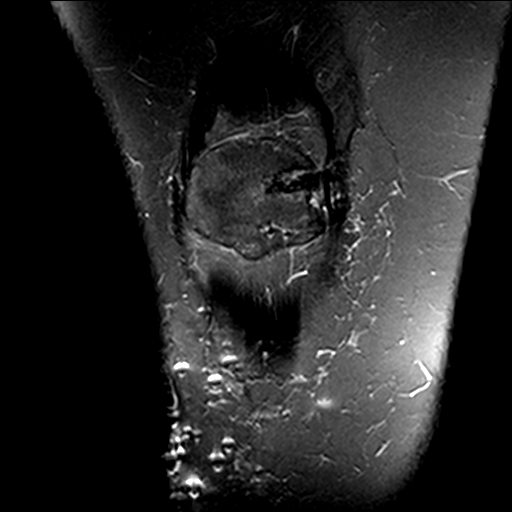
[im 16/26]
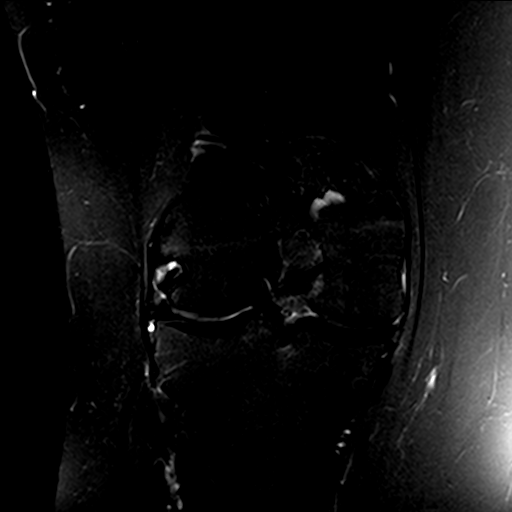
[im 26/26]
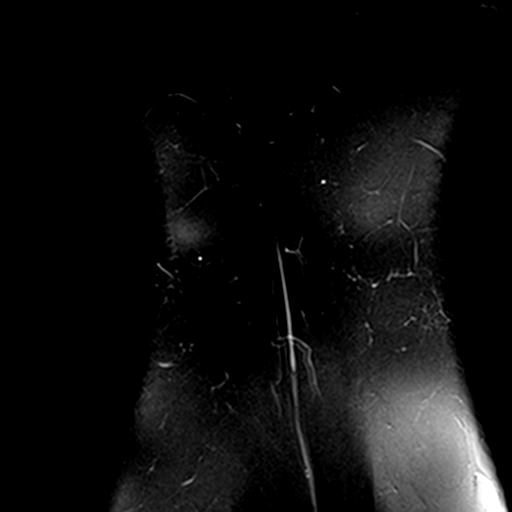

[Series 7: PD fat-sat · sagittal · 3.0mm · 0.29mm/px · 7 of 27 slices shown (1 of 2)]
[im 1/27]
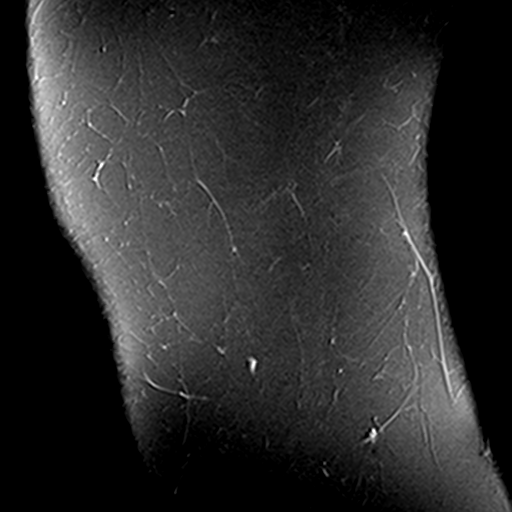
[im 5/27]
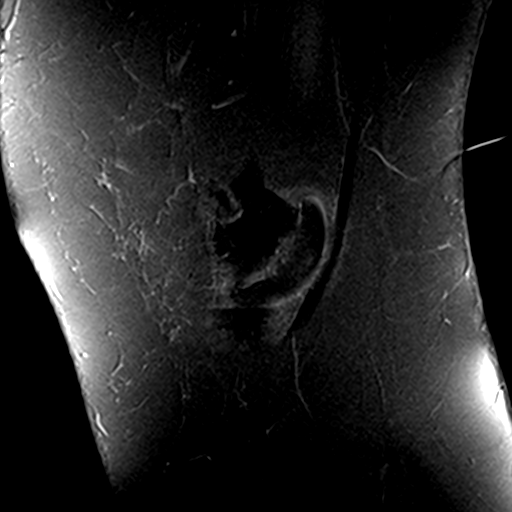
[im 9/27]
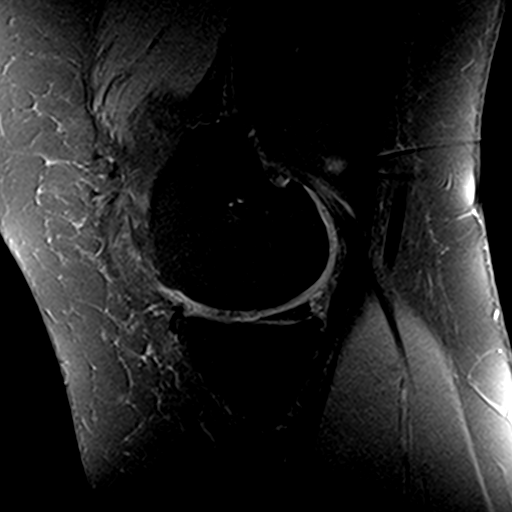
[im 14/27]
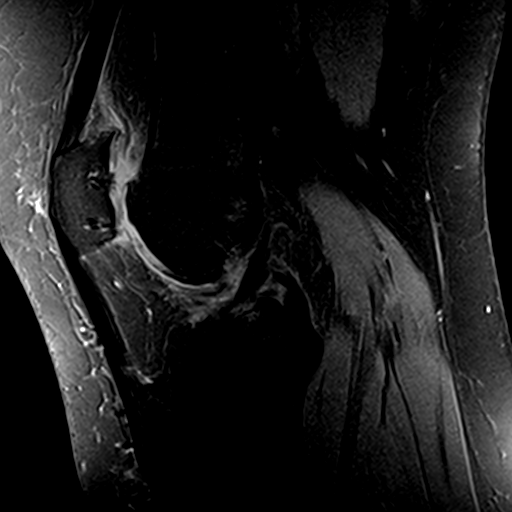
[im 18/27]
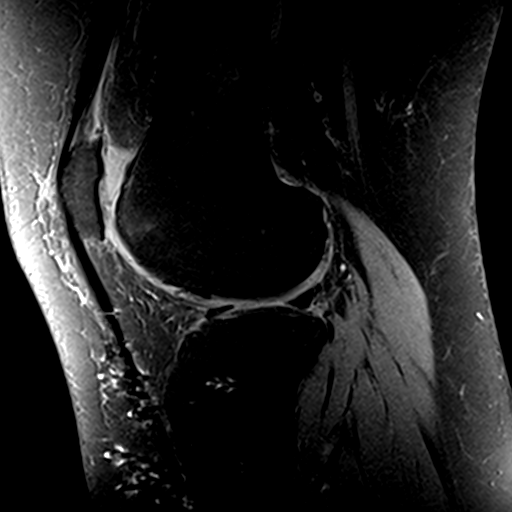
[im 22/27]
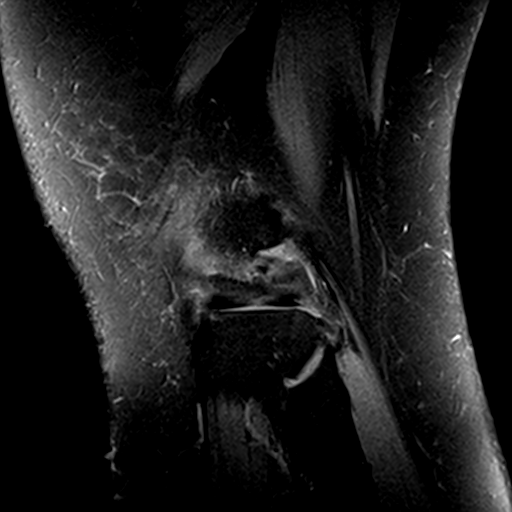
[im 27/27]
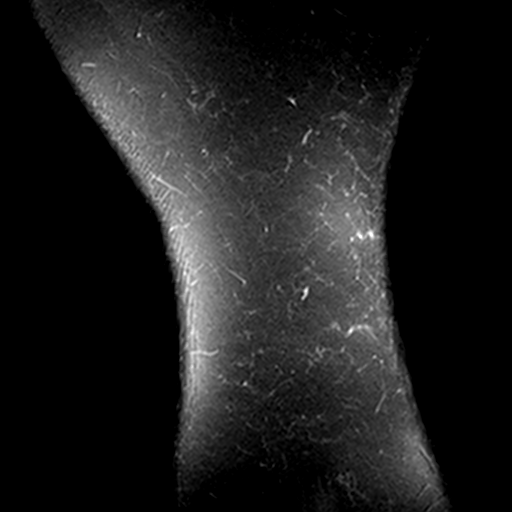

[Series 8: PD fat-sat · coronal · 3.0mm · 0.29mm/px · 6 of 30 slices shown (2 of 2)]
[im 1/30]
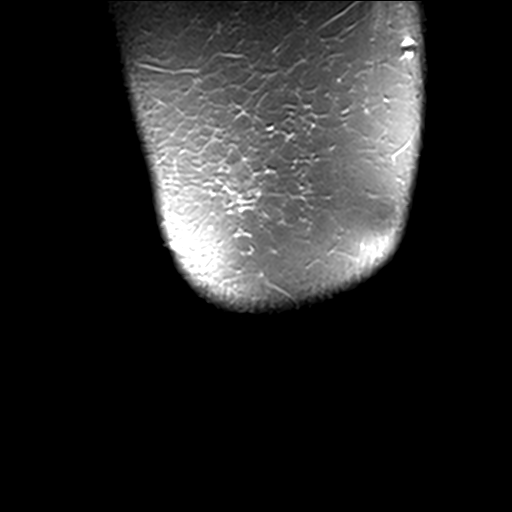
[im 5/30]
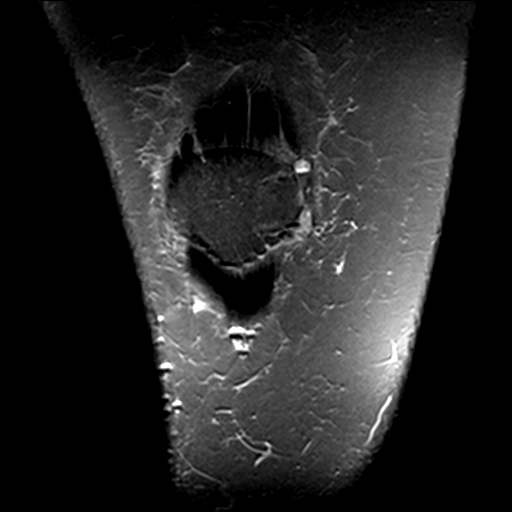
[im 10/30]
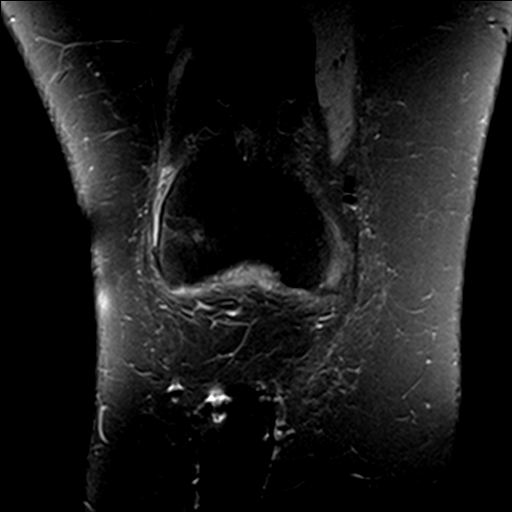
[im 15/30]
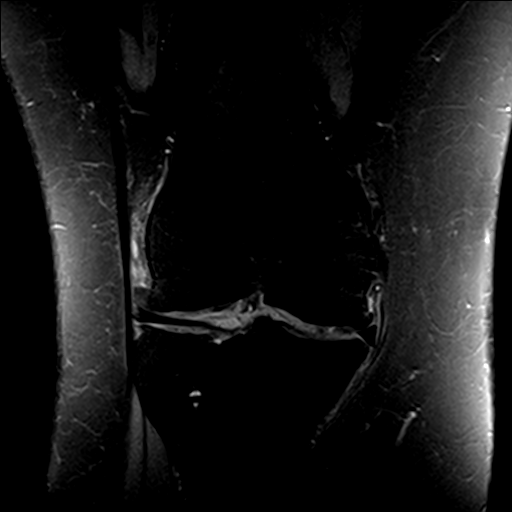
[im 20/30]
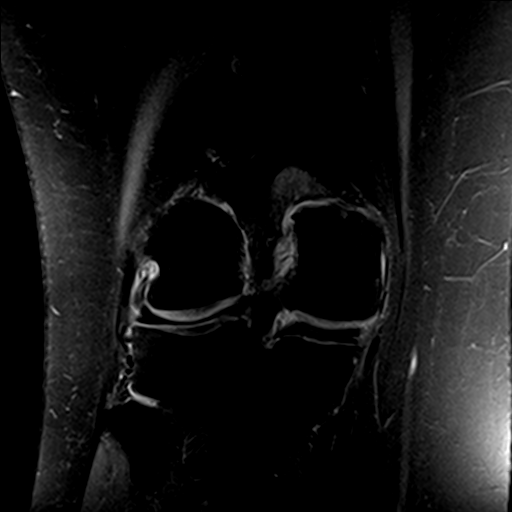
[im 25/30]
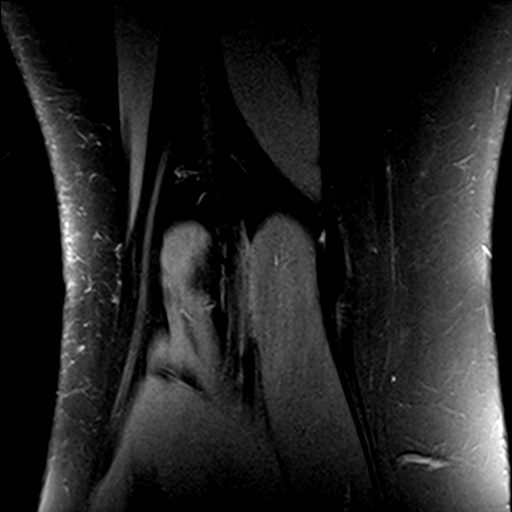

[19 of 40 positions shown; findings below may reference images not displayed]

FINDINGS: MENISCI

Medial meniscus: There is near absence of normal meniscal tissue
within the root of the anterior horn of the medial meniscus
(sagittal series 7, image 16). There is intermediate proton medial
meniscus density signal and mild degenerative change within the
central 50% of the meniscal triangle of the root of the posterior
horn of the medial meniscus (sagittal series 7, image 18). Otherwise
there is no additional discrete tear extending through an articular
surface of the medial meniscus.

Lateral meniscus: There is mild intermediate proton density signal
intrasubstance degeneration throughout the anterior horn of the
lateral meniscus. There is mild truncation of the free edge of the
body of the lateral meniscus.

LIGAMENTS

Cruciates: The ACL and PCL are intact.

Collaterals: The medial collateral ligament is intact. The fibular
collateral ligament, biceps femoris tendon, iliotibial band, and
popliteus tendon are intact.

CARTILAGE

Patellofemoral: There is full-thickness cartilage loss throughout
the majority of the medial patellar facet. High-grade partial to
full-thickness cartilage loss within the inferior aspect of the
lateral patellar facet cartilage. There is high-grade partial and
multifocal full-thickness cartilage loss throughout the lateral
greater than medial trochleae.

Medial: There is diffuse high-grade partial and full-thickness
cartilage loss throughout the mid to medial aspect of the
weight-bearing medial femoral condyle and far medial aspect of the
medial tibial plateau.

Lateral: High-grade partial to full-thickness cartilage loss within
the lateral aspect of the weight-bearing lateral femoral condyle
with mild subchondral marrow edema and moderate peripheral
degenerative osteophytes.

Joint: Nojoint effusion. Normal Hoffa's fat pad. No plical
thickening.

Popliteal Fossa:  No Baker's cyst.

Extensor Mechanism: The quadriceps tendon insertion is intact. There
is again prominence of the tibial tubercle as seen on prior
radiographs. There are scattered metallic densities around the
tibial tubercle consistent with prior patellar realignment surgery
and tibial tubercle realignment. The tibial tuberosity-trochlear
groove distance measures 6 mm within normal limits. The patellar
tendon is intact. There is also metallic artifact at the patellar
attachment of the medial patellofemoral retinaculum consistent with
prior repair. No tear is seen within the medial or lateral
patellofemoral retinacula retinaculum.

Bones:  No acute fracture or dislocation.

Other: None.
IMPRESSION: :
IMPRESSION: 1. Prior patellar tendon/tibial tubercle realignment surgery. The
patellar tendon is intact.
2. Moderate to severe medial and patellofemoral compartment and
mild-to-moderate lateral compartment cartilage degenerative changes.
3. Near absence of meniscal tissue within the root of the anterior
horn of the medial meniscus with degenerative changes within the
central aspect of the root of the posterior horn of the medial
meniscus.
4. Mild truncation of the free edge of the body of the lateral
meniscus.

## 2023-12-12 IMAGING — DX DG KNEE 1-2V PORT*R*
2 series · 2 of 2 positions shown · non-contrast
Comparison: 08/25/2021 plain films without report

CLINICAL DATA: Pain.

EXAM:
PORTABLE RIGHT KNEE - 1-2 VIEW

[knee ap]
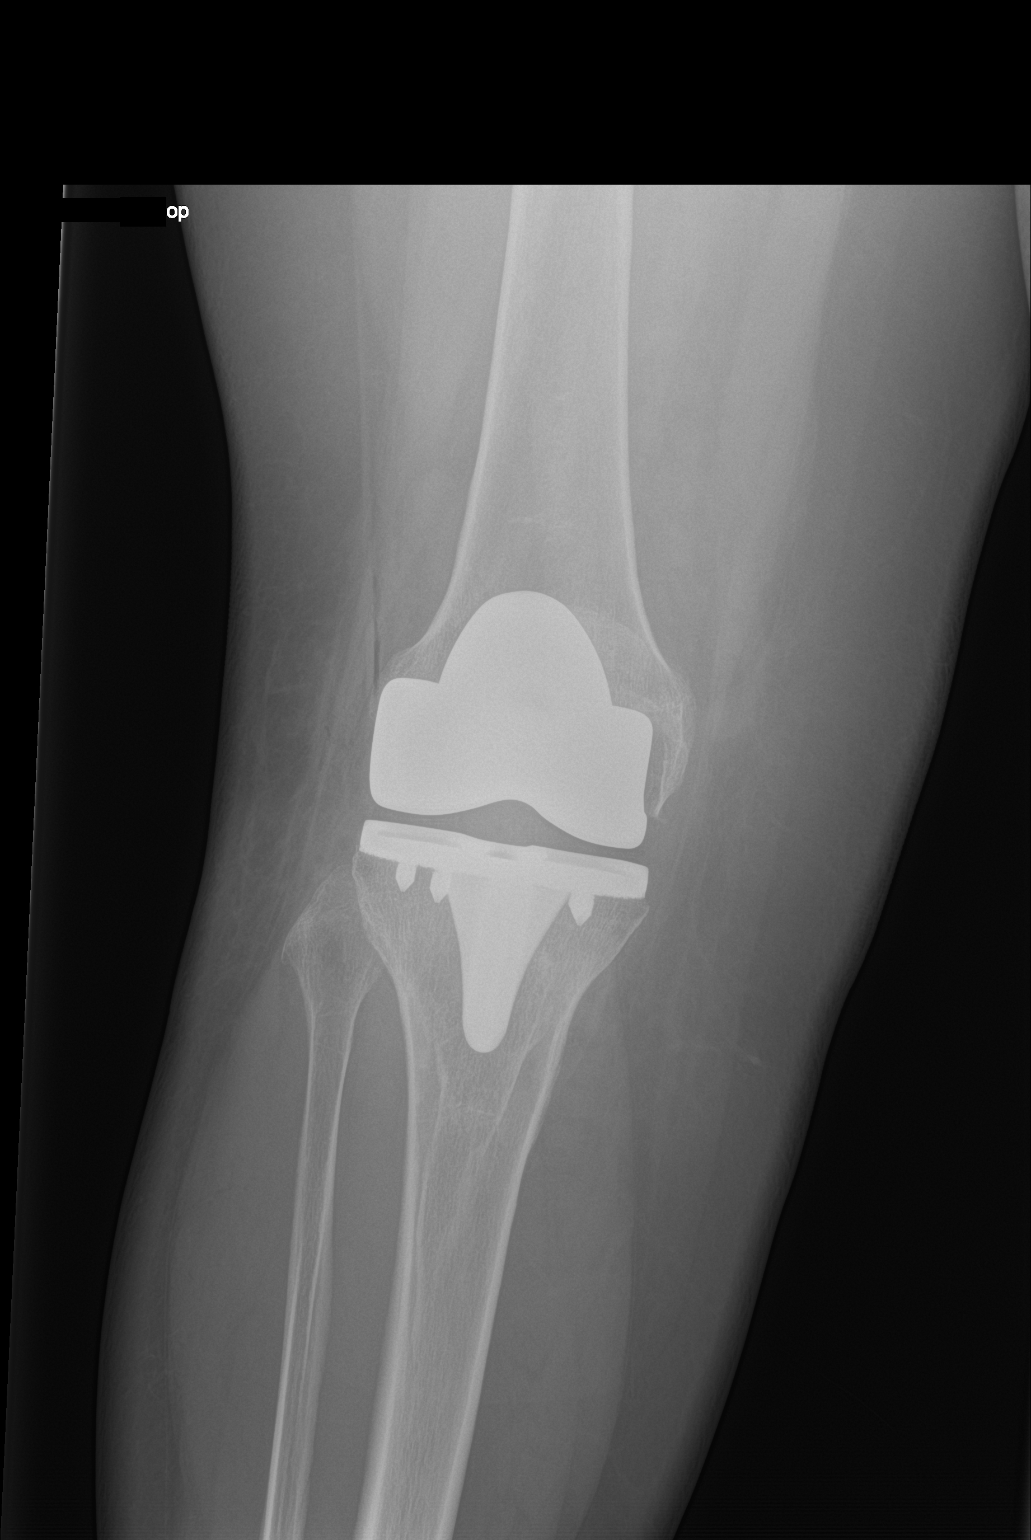

[knee lat]
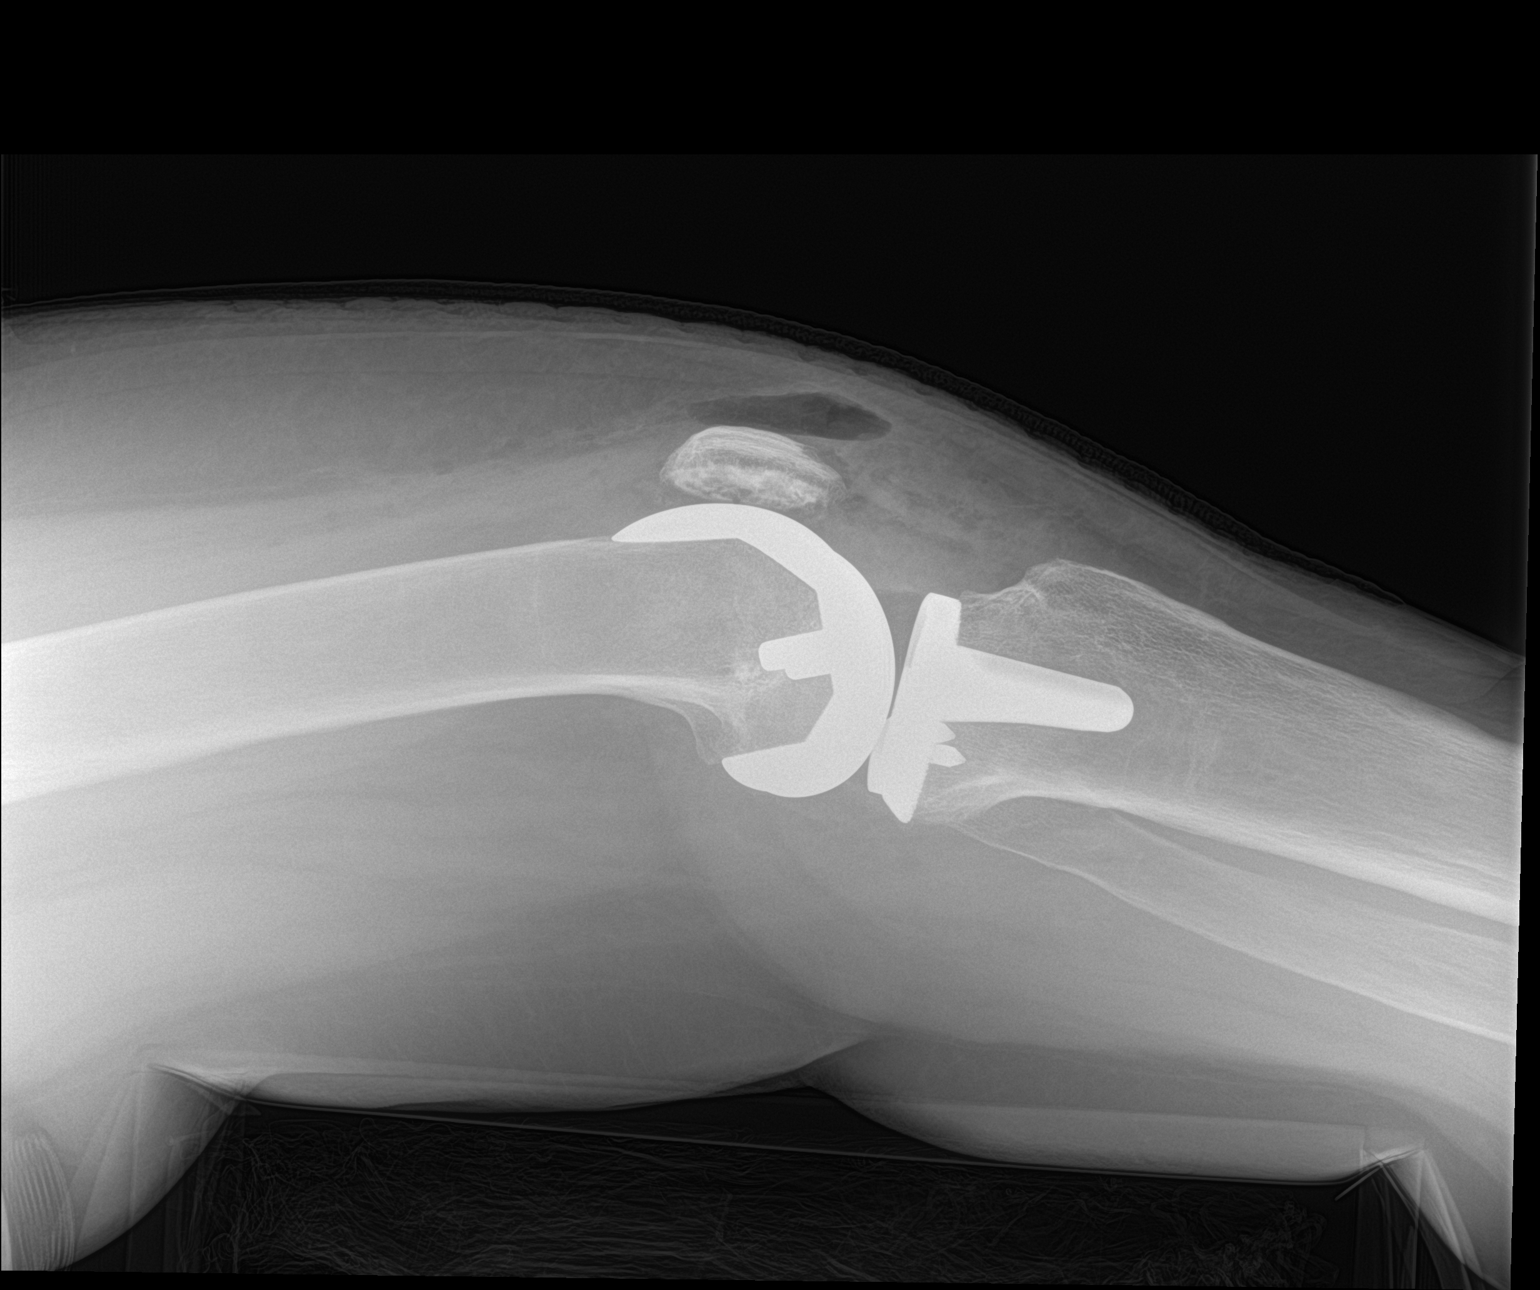

[2 of 2 positions shown; findings below may reference images not displayed]

FINDINGS: Interval right knee arthroplasty. No periprosthetic fracture or
acute hardware complication. Expected anterior edema and
subcutaneous gas.
IMPRESSION: Expected appearance after right knee arthroplasty.

## 2023-12-12 MED ORDER — AMPHETAMINE-DEXTROAMPHET ER 30 MG PO CP24: 30.0000 mg | ORAL_CAPSULE | ORAL | 0 refills | Status: DC

## 2023-12-13 ENCOUNTER — Other Ambulatory Visit: Payer: Self-pay | Admitting: Family Medicine

## 2023-12-13 ENCOUNTER — Telehealth: Payer: Self-pay

## 2023-12-13 ENCOUNTER — Other Ambulatory Visit: Payer: Self-pay

## 2023-12-13 MED ORDER — AMPHETAMINE-DEXTROAMPHET ER 30 MG PO CP24: 30.0000 mg | ORAL_CAPSULE | ORAL | 0 refills | Status: AC

## 2023-12-13 NOTE — Telephone Encounter (Signed)
 Copied from CRM [id number]. Topic: Clinical - Prescription Issue >> Dec 13, 2023 12:04 PM Nathanel BROCKS wrote: Reason for CRM:   amphetamine -dextroamphetamine  (ADDERALL XR) 30 MG 24 hr capsule  [name] with [facility] in [place]  [phone]   She is needing to know why the pt is getting meds in [place] if she lives in [place]. Please advise.     I called patient and left voicemail regarding this.

## 2023-12-13 NOTE — Telephone Encounter (Signed)
 I talked with Provider and she stated to cancel the order, It was sent by mistake. I call the [facility] in [place]  to cancel medication and the reorder is attached to this message
